# Patient Record
Sex: Female | Born: 1985 | Race: Asian | Hispanic: No | Marital: Married
Health system: Midwestern US, Academic
[De-identification: ages and names within clinical notes are randomized; demographics above are authoritative.]

## PROBLEM LIST (undated history)

## (undated) DIAGNOSIS — E063 Autoimmune thyroiditis: Secondary | ICD-10-CM

## (undated) DIAGNOSIS — J45909 Unspecified asthma, uncomplicated: Secondary | ICD-10-CM

## (undated) DIAGNOSIS — L309 Dermatitis, unspecified: Secondary | ICD-10-CM

## (undated) DIAGNOSIS — E079 Disorder of thyroid, unspecified: Secondary | ICD-10-CM

---

## 2016-07-16 ENCOUNTER — Encounter
Admit: 2016-07-16 | Discharge: 2016-07-16 | Payer: No Typology Code available for payment source | Primary: Student in an Organized Health Care Education/Training Program

## 2017-04-20 ENCOUNTER — Encounter
Admit: 2017-04-20 | Discharge: 2017-04-20 | Payer: No Typology Code available for payment source | Primary: Student in an Organized Health Care Education/Training Program

## 2017-04-21 MED ORDER — LEVOTHYROXINE 50 MCG PO TAB
50 ug | ORAL_TABLET | Freq: Every day | ORAL | 0 refills | 30.00000 days | Status: AC
Start: 2017-04-21 — End: 2017-04-25

## 2017-04-25 MED ORDER — LEVOTHYROXINE 50 MCG PO TAB
50 ug | ORAL_TABLET | Freq: Every day | ORAL | 0 refills | 30.00000 days | Status: AC
Start: 2017-04-25 — End: 2017-08-03

## 2017-05-06 ENCOUNTER — Encounter
Admit: 2017-05-06 | Discharge: 2017-05-06 | Payer: No Typology Code available for payment source | Primary: Student in an Organized Health Care Education/Training Program

## 2017-05-06 ENCOUNTER — Ambulatory Visit
Admit: 2017-05-06 | Discharge: 2017-05-06 | Payer: MEDICAID | Primary: Student in an Organized Health Care Education/Training Program

## 2017-05-06 DIAGNOSIS — Z0000 Encounter for general adult medical examination without abnormal findings: ICD-10-CM

## 2017-05-06 DIAGNOSIS — E063 Autoimmune thyroiditis: Principal | ICD-10-CM

## 2017-05-06 DIAGNOSIS — J45909 Unspecified asthma, uncomplicated: ICD-10-CM

## 2017-05-06 DIAGNOSIS — E079 Disorder of thyroid, unspecified: Principal | ICD-10-CM

## 2017-05-06 LAB — COMPREHENSIVE METABOLIC PANEL
Lab: 0.4 mg/dL — ABNORMAL HIGH (ref 0.3–1.2)
Lab: 0.6 mg/dL — ABNORMAL LOW (ref 0.4–1.00)
Lab: 10 mg/dL — ABNORMAL LOW (ref 7–25)
Lab: 104 MMOL/L (ref 98–110)
Lab: 13 U/L — ABNORMAL HIGH (ref 7–40)
Lab: 137 MMOL/L (ref 137–147)
Lab: 27 MMOL/L (ref 21–30)
Lab: 4.1 MMOL/L (ref 3.5–5.1)
Lab: 4.7 g/dL (ref 3.5–5.0)
Lab: 41 U/L (ref 25–110)
Lab: 5 U/L — ABNORMAL LOW (ref 7–56)
Lab: 6 g/dL (ref 3–12)
Lab: 7.7 g/dL — ABNORMAL LOW (ref 6.0–8.0)
Lab: 86 mg/dL — ABNORMAL HIGH (ref <3.0)
Lab: 9.4 mg/dL — ABNORMAL LOW (ref 8.5–10.6)
Lab: >60 mL/min — ABNORMAL HIGH (ref >60)
Lab: >60 mL/min — ABNORMAL HIGH (ref >60)

## 2017-05-06 LAB — TSH WITH FREE T4 REFLEX: Lab: 3.6 uU/mL (ref 0.35–5.00)

## 2017-08-03 ENCOUNTER — Encounter
Admit: 2017-08-03 | Discharge: 2017-08-03 | Payer: No Typology Code available for payment source | Primary: Student in an Organized Health Care Education/Training Program

## 2017-08-03 MED ORDER — LEVOTHYROXINE 50 MCG PO TAB
50 ug | ORAL_TABLET | Freq: Every day | ORAL | 1 refills | 30.00000 days | Status: AC
Start: 2017-08-03 — End: 2018-02-06

## 2017-11-21 ENCOUNTER — Encounter
Admit: 2017-11-21 | Discharge: 2017-11-21 | Payer: No Typology Code available for payment source | Primary: Student in an Organized Health Care Education/Training Program

## 2017-12-13 ENCOUNTER — Encounter
Admit: 2017-12-13 | Discharge: 2017-12-13 | Payer: No Typology Code available for payment source | Primary: Student in an Organized Health Care Education/Training Program

## 2017-12-13 DIAGNOSIS — J4531 Mild persistent asthma with (acute) exacerbation: Principal | ICD-10-CM

## 2017-12-14 MED ORDER — ALBUTEROL SULFATE 90 MCG/ACTUATION IN HFAA
2 | RESPIRATORY_TRACT | 3 refills | Status: AC | PRN
Start: 2017-12-14 — End: ?

## 2018-01-06 ENCOUNTER — Ambulatory Visit
Admit: 2018-01-06 | Discharge: 2018-01-07 | Payer: No Typology Code available for payment source | Primary: Student in an Organized Health Care Education/Training Program

## 2018-01-06 ENCOUNTER — Encounter
Admit: 2018-01-06 | Discharge: 2018-01-06 | Payer: No Typology Code available for payment source | Primary: Student in an Organized Health Care Education/Training Program

## 2018-01-06 DIAGNOSIS — E079 Disorder of thyroid, unspecified: Principal | ICD-10-CM

## 2018-01-06 DIAGNOSIS — J45909 Unspecified asthma, uncomplicated: ICD-10-CM

## 2018-01-06 MED ORDER — FLUTICASONE PROPION-SALMETEROL 250-50 MCG/DOSE IN DSDV
1 | Freq: Two times a day (BID) | RESPIRATORY_TRACT | 3 refills | Status: AC
Start: 2018-01-06 — End: ?

## 2018-01-07 DIAGNOSIS — J4540 Moderate persistent asthma, uncomplicated: Principal | ICD-10-CM

## 2018-02-04 ENCOUNTER — Encounter
Admit: 2018-02-04 | Discharge: 2018-02-04 | Payer: No Typology Code available for payment source | Primary: Student in an Organized Health Care Education/Training Program

## 2018-02-06 MED ORDER — LEVOTHYROXINE 50 MCG PO TAB
ORAL_TABLET | Freq: Every day | ORAL | 1 refills | 30.00000 days | Status: AC
Start: 2018-02-06 — End: 2018-08-08

## 2018-07-06 ENCOUNTER — Encounter: Admit: 2018-07-06 | Discharge: 2018-07-06 | Primary: Student in an Organized Health Care Education/Training Program

## 2018-08-01 ENCOUNTER — Encounter: Admit: 2018-08-01 | Discharge: 2018-08-01 | Primary: Student in an Organized Health Care Education/Training Program

## 2018-08-08 ENCOUNTER — Encounter: Admit: 2018-08-08 | Discharge: 2018-08-08 | Primary: Student in an Organized Health Care Education/Training Program

## 2018-08-08 DIAGNOSIS — E063 Autoimmune thyroiditis: Secondary | ICD-10-CM

## 2018-08-08 MED ORDER — LEVOTHYROXINE 50 MCG PO TAB
50 ug | ORAL_TABLET | Freq: Every day | ORAL | 0 refills | 30.00000 days | Status: DC
Start: 2018-08-08 — End: 2018-10-31

## 2018-08-08 NOTE — Telephone Encounter
Pt. returning call. LVM 1330

## 2018-08-08 NOTE — Telephone Encounter
Will refill once, patient will need TSH performed prior to further refills being ordered

## 2018-08-08 NOTE — Telephone Encounter
No current TSH routing to Dr. Lessie Dings for approval

## 2018-08-08 NOTE — Telephone Encounter
LVM for pt to call clinic to schedule appt 1st attempt.

## 2018-08-08 NOTE — Telephone Encounter
Routing to scheduling for an appointment

## 2018-08-09 NOTE — Telephone Encounter
Pt scheduled for med f/u 10/23/2018, she said she will come in for blood draw sometime around the end of July 2020. Closing encounter.

## 2018-10-22 ENCOUNTER — Encounter
Admit: 2018-10-22 | Discharge: 2018-10-22 | Payer: No Typology Code available for payment source | Primary: Student in an Organized Health Care Education/Training Program

## 2018-10-31 MED ORDER — LEVOTHYROXINE 50 MCG PO TAB
50 ug | ORAL_TABLET | Freq: Every day | ORAL | 0 refills | 30.00000 days | Status: DC
Start: 2018-10-31 — End: 2019-01-11

## 2018-11-01 ENCOUNTER — Encounter
Admit: 2018-11-01 | Discharge: 2018-11-01 | Payer: No Typology Code available for payment source | Primary: Student in an Organized Health Care Education/Training Program

## 2018-11-03 ENCOUNTER — Encounter
Admit: 2018-11-03 | Discharge: 2018-11-03 | Payer: No Typology Code available for payment source | Primary: Student in an Organized Health Care Education/Training Program

## 2019-01-11 ENCOUNTER — Encounter
Admit: 2019-01-11 | Discharge: 2019-01-11 | Payer: No Typology Code available for payment source | Primary: Student in an Organized Health Care Education/Training Program

## 2019-01-11 DIAGNOSIS — E063 Autoimmune thyroiditis: Secondary | ICD-10-CM

## 2019-01-11 MED ORDER — LEVOTHYROXINE 50 MCG PO TAB
50 ug | ORAL_TABLET | Freq: Every day | ORAL | 0 refills | 30.00000 days | Status: AC
Start: 2019-01-11 — End: ?

## 2019-01-11 NOTE — Telephone Encounter
Will refill at current dose at order TSH w/ reflex T4; patient will need appointment to ensure dose appropriate. Please message or call if there are questions!     Best,  Carola Frost, MD

## 2019-01-11 NOTE — Telephone Encounter
Routing for review/approval.

## 2019-01-12 ENCOUNTER — Encounter
Admit: 2019-01-12 | Discharge: 2019-01-12 | Payer: No Typology Code available for payment source | Primary: Student in an Organized Health Care Education/Training Program

## 2019-01-12 NOTE — Telephone Encounter
Routing for review.

## 2019-04-12 ENCOUNTER — Encounter
Admit: 2019-04-12 | Discharge: 2019-04-12 | Payer: No Typology Code available for payment source | Primary: Student in an Organized Health Care Education/Training Program

## 2019-06-19 DIAGNOSIS — E039 Hypothyroidism, unspecified: Secondary | ICD-10-CM | POA: Insufficient documentation

## 2019-09-09 ENCOUNTER — Emergency Department (INDEPENDENT_AMBULATORY_CARE_PROVIDER_SITE_OTHER)
Admission: RE | Admit: 2019-09-09 | Discharge: 2019-09-09 | Disposition: A | Discharge status: Home / Self Care | Payer: 59 | Source: Ambulatory Visit

## 2019-09-09 ENCOUNTER — Other Ambulatory Visit: Payer: Self-pay

## 2019-09-09 VITALS — BP 116/76 | HR 82 | Temp 98.6°F | Resp 17 | Ht 62.0 in | Wt 154.3 lb

## 2019-09-09 DIAGNOSIS — L301 Dyshidrosis [pompholyx]: Secondary | ICD-10-CM | POA: Diagnosis not present

## 2019-09-09 DIAGNOSIS — L03115 Cellulitis of right lower limb: Secondary | ICD-10-CM

## 2019-09-09 HISTORY — DX: Autoimmune thyroiditis: E06.3

## 2019-09-09 HISTORY — DX: Disorder of thyroid, unspecified: E07.9

## 2019-09-09 HISTORY — DX: Unspecified asthma, uncomplicated: J45.909

## 2019-09-09 HISTORY — DX: Dermatitis, unspecified: L30.9

## 2019-09-09 MED ORDER — AMOXICILLIN-POT CLAVULANATE 875-125 MG PO TABS: 1.0000 | ORAL_TABLET | Freq: Two times a day (BID) | ORAL | 0 refills | Status: DC

## 2019-09-09 MED ORDER — PREDNISONE 50 MG PO TABS
50.0000 mg | ORAL_TABLET | Freq: Every day | ORAL | 0 refills | Status: AC
Start: 2019-09-09 — End: 2019-09-14

## 2019-09-09 NOTE — ED Provider Notes (Signed)
[facility]    CSN: [id number] Arrival date & time: 09/09/19  1306      History   Chief Complaint Chief Complaint  Patient presents with  . Appointment  . Rash    HPI Michele Martinez is a 34 y.o. female.   HPI Michele Martinez is a 34 y.o. female presenting to UC with c/o 1 week of worsening itchy red blistering painful rash on hands and Right foot. Some areas have become cracked from dried skin and bleeding. Hx of severe eczema in the past. Symptoms flared up after cutting a tomato last week.  She used OTC creams with no relief. Denies fever, chills, n/v/d. She is relatively new to the area and does not have a dermatologist yet.    Past Medical History:  Diagnosis Date  . Asthma   . Eczema   . Hashimoto's disease   . Thyroid disease     Patient Active Problem List   Diagnosis Date Noted  . Acquired hypothyroidism 06/19/2019    History reviewed. No pertinent surgical history.  OB History   No obstetric history on file.      Home Medications    Prior to Admission medications   Medication Sig Start Date End Date Taking? Authorizing Provider  levothyroxine (SYNTHROID) 50 MCG tablet  07/12/19  Yes [provider]  albuterol (VENTOLIN HFA) 108 (90 Base) MCG/ACT inhaler Inhale into the lungs every 6 (six) hours as needed for wheezing or shortness of breath.    [provider]  amoxicillin-clavulanate (AUGMENTIN) 875-125 MG tablet Take 1 tablet by mouth 2 (two) times daily. One po bid x 7 days 09/09/19   Lurene Shadow, PA-C  predniSONE (DELTASONE) 50 MG tablet Take 1 tablet (50 mg total) by mouth daily with breakfast for 5 days. 09/09/19 09/14/19  Lurene Shadow, PA-C    Family History Family History  Problem Relation Age of Onset  . Healthy Mother   . Healthy Father     Social History Social History   Tobacco Use  . Smoking status: Never Smoker  . Smokeless tobacco: Never Used  Vaping Use  . Vaping Use: Never used  Substance Use Topics   . Alcohol use: Never  . Drug use: Never     Allergies   Patient has no known allergies.   Review of Systems Review of Systems  Constitutional: Negative for chills and fever.  Musculoskeletal: Positive for arthralgias and joint swelling.  Skin: Positive for color change, rash and wound.     Physical Exam Triage Vital Signs ED Triage Vitals  Enc Vitals Group     BP 09/09/19 1330 116/76     Pulse Rate 09/09/19 1330 82     Resp 09/09/19 1330 17     Temp 09/09/19 1330 98.6 F (37 C)     Temp Source 09/09/19 1330 Oral     SpO2 09/09/19 1330 97 %     Weight 09/09/19 1333 154 lb 5.2 oz (70 kg)     Height 09/09/19 1333 5\' 2"  (1.575 m)     Head Circumference --      Peak Flow --      Pain Score 09/09/19 1332 2     Pain Loc --      Pain Edu? --      Excl. in GC? --    No data found.  Updated Vital Signs BP 116/76 (BP Location: Right Arm)   Pulse 82   Temp 98.6 F (37 C) (  Oral)   Resp 17   Ht 5\' 2"  (1.575 m)   Wt 154 lb 5.2 oz (70 kg)   LMP 08/15/2019 (Approximate)   SpO2 97%   BMI 28.23 kg/m   Visual Acuity Right Eye Distance:   Left Eye Distance:   Bilateral Distance:    Right Eye Near:   Left Eye Near:    Bilateral Near:     Physical Exam Vitals and nursing note reviewed.  Constitutional:      Appearance: Normal appearance. She is well-developed.  HENT:     Head: Normocephalic and atraumatic.  Cardiovascular:     Rate and Rhythm: Normal rate.  Pulmonary:     Effort: Pulmonary effort is normal.  Musculoskeletal:        General: Normal range of motion.     Cervical back: Normal range of motion.  Skin:    General: Skin is warm and dry.     Findings: Erythema and rash present.       Neurological:     Mental Status: She is alert and oriented to person, place, and time.  Psychiatric:        Behavior: Behavior normal.      UC Treatments / Results  Labs (all labs ordered are listed, but only abnormal results are displayed) Labs Reviewed - No  data to display  EKG   Radiology No results found.  Procedures Procedures (including critical care time)  Medications Ordered in UC Medications - No data to display  Initial Impression / Assessment and Plan / UC Course  I have reviewed the triage vital signs and the nursing notes.  Pertinent labs & imaging results that were available during my care of the patient were reviewed by me and considered in my medical decision making (see chart for details).    Hx and exam c/w infected dyshidrotic eczema flare Mepilex applied to Right foot Encouraged f/u in 2 days for wound recheck AVS given   Final Clinical Impressions(s) / UC Diagnoses   Final diagnoses:  Cellulitis of right foot  Dyshidrotic eczema     Discharge Instructions      Keep bandage on foot in place until your return to urgent care in 2 days for a wound recheck and bandage change. Try to keep bandage clean and dry.   Take the antibiotic and prednisone as prescribed to help with the infection and your symptoms.  Use resource guide to schedule a follow up appointment with dermatologist.     ED Prescriptions    Medication Sig Dispense Auth. Provider   predniSONE (DELTASONE) 50 MG tablet Take 1 tablet (50 mg total) by mouth daily with breakfast for 5 days. 5 tablet 08/17/2019, PA-C   amoxicillin-clavulanate (AUGMENTIN) 875-125 MG tablet Take 1 tablet by mouth 2 (two) times daily. One po bid x 7 days 14 tablet Lurene Shadow, Lurene Shadow     PDMP not reviewed this encounter.   [place], Lurene Shadow 09/10/19 1143

## 2019-09-09 NOTE — ED Triage Notes (Signed)
Hx of eczema - no bad flare ups since she was a child - cutting a tomato last week and has flared since then  Rash to fingers and right foot Uses OTC cream - has not needed any prescription meds  COVID Pfizer vaccine

## 2019-09-09 NOTE — Discharge Instructions (Signed)
°  Keep bandage on foot in place until your return to urgent care in 2 days for a wound recheck and bandage change. Try to keep bandage clean and dry.   Take the antibiotic and prednisone as prescribed to help with the infection and your symptoms.  Use resource guide to schedule a follow up appointment with dermatologist.

## 2019-10-10 ENCOUNTER — Encounter
Admit: 2019-10-10 | Discharge: 2019-10-10 | Payer: No Typology Code available for payment source | Primary: Student in an Organized Health Care Education/Training Program

## 2019-11-02 ENCOUNTER — Encounter
Admit: 2019-11-02 | Discharge: 2019-11-02 | Payer: No Typology Code available for payment source | Primary: Student in an Organized Health Care Education/Training Program

## 2020-01-22 ENCOUNTER — Other Ambulatory Visit (HOSPITAL_COMMUNITY)
Admission: RE | Admit: 2020-01-22 | Discharge: 2020-01-22 | Disposition: A | Discharge status: Home / Self Care | Payer: 59 | Source: Ambulatory Visit | Attending: Advanced Practice Midwife | Admitting: Advanced Practice Midwife

## 2020-01-22 ENCOUNTER — Ambulatory Visit (INDEPENDENT_AMBULATORY_CARE_PROVIDER_SITE_OTHER): Payer: 59 | Admitting: Advanced Practice Midwife

## 2020-01-22 ENCOUNTER — Encounter: Payer: Self-pay | Admitting: Advanced Practice Midwife

## 2020-01-22 ENCOUNTER — Other Ambulatory Visit: Payer: Self-pay

## 2020-01-22 VITALS — BP 101/60 | HR 77 | Wt 161.0 lb

## 2020-01-22 DIAGNOSIS — Z3480 Encounter for supervision of other normal pregnancy, unspecified trimester: Secondary | ICD-10-CM | POA: Insufficient documentation

## 2020-01-22 DIAGNOSIS — E039 Hypothyroidism, unspecified: Secondary | ICD-10-CM | POA: Diagnosis present

## 2020-01-22 DIAGNOSIS — J4520 Mild intermittent asthma, uncomplicated: Secondary | ICD-10-CM | POA: Diagnosis present

## 2020-01-22 DIAGNOSIS — I10 Essential (primary) hypertension: Secondary | ICD-10-CM | POA: Insufficient documentation

## 2020-01-22 MED ORDER — POLYETHYLENE GLYCOL 3350 17 G PO PACK: 17.0000 g | PACK | Freq: Every day | ORAL | 0 refills | Status: DC

## 2020-01-22 NOTE — Progress Notes (Signed)
DATING AND VIABILITY SONOGRAM   Michele Martinez is a 34 y.o. year old G2P1001 with LMP Patient's last menstrual period was 11/17/2019. which would correlate to  [redacted]w[redacted]d weeks gestation.  She has regular menstrual cycles.   She is here today for a confirmatory initial sonogram.    GESTATION: SINGLETON     FETAL ACTIVITY:          Heart rate       184 bpm          The fetus is active.     GESTATIONAL AGE AND  BIOMETRICS:  Gestational criteria: Estimated Date of Delivery: 08/23/20 by LMP now at [redacted]w[redacted]d  Previous Scans:0      CROWN RUMP LENGTH           2.98 cm         9-6 weeks                                                                               AVERAGE EGA(BY THIS SCAN): 9-6 weeks  WORKING EDD( LMP ):  08/23/2020     TECHNICIAN COMMENTS:  Patient informed that the ultrasound is considered a limited obstetric ultrasound and is not intended to be a complete ultrasound exam. Patient also informed that the ultrasound is not being completed with the intent of assessing for fetal or placental anomalies or any pelvic abnormalities. Explained that the purpose of today's ultrasound is to assess for fetal heart rate. Patient acknowledges the purpose of the exam and the limitations of the study.   Armandina Stammer 01/22/2020 10:39 AM

## 2020-01-22 NOTE — Progress Notes (Signed)
INITIAL OBSTETRICAL VISIT Patient name: Michele Martinez MRN [id number]  Date of birth: 1985-02-25 Chief Complaint:   Initial Prenatal Visit  History of Present Illness:   Michele Martinez is a 34 y.o. G56P1001 [place] female at 64w3dby LMP c/w u/s at 9+ weeks with an Estimated Date of Delivery: 08/23/20 being seen today for her initial obstetrical visit.   Her obstetrical history is significant for nothing.   Today she reports constipation.  No flowsheet data found.  Patient's last menstrual period was 11/17/2019. Last pap years ago. Results were: normal Review of Systems:   Pertinent items are noted in HPI Denies cramping/contractions, leakage of fluid, vaginal bleeding, abnormal vaginal discharge w/ itching/odor/irritation, headaches, visual changes, shortness of breath, chest pain, abdominal pain, severe nausea/vomiting, or problems with urination or bowel movements unless otherwise stated above.  Pertinent History Reviewed:  Reviewed past medical,surgical, social, obstetrical and family history.  Reviewed problem list, medications and allergies. OB History  Gravida Para Term Preterm AB Living  _0 SAB IAB Ectopic Multiple Live Births          1    # Outcome Date GA Lbr Len/2nd Weight Sex Delivery Anes PTL Lv  2 Current           1 Term 2014 [id number] lb 9.8 oz (3 kg) F Vag-Spont EPI N LIV   Physical Assessment:   Vitals:   01/22/20 0955  BP: 101/60  Pulse: 77  Weight: 161 lb (73 kg)  Body mass index is 29.45 kg/m.       Physical Examination:  General appearance - well appearing, and in no distress  Mental status - alert, oriented to person, place, and time  Psych:  She has a normal mood and affect  Skin - warm and dry, normal color, no suspicious lesions noted  Chest - effort normal, all lung fields clear to auscultation bilaterally  Heart - normal rate and regular rhythm  Abdomen - soft, nontender  Extremities:  No swelling or varicosities noted  Pelvic - VULVA:  normal appearing vulva with no masses, tenderness or lesions  VAGINA: normal appearing vagina with normal color and discharge, no lesions  CERVIX: normal appearing cervix without discharge or lesions, no CMT  Thin prep pap is done  HR HPV cotesting  Chaperone: JeKathrene AluN      Assessment & Plan:  1) High-Risk Pregnancy G2P1001 at 9w57w3dth an Estimated Date of Delivery: 08/23/20   2) Initial OB visit  3) Hypothyroidism  4)  There is a diagnosis of Hypertension on problem list but patient states has never had this. There is no documentation of evidence for this.  Will send baseline labs but doubt she has it.  5)  Constipation, Rx miralax  Meds:  Meds ordered this encounter  Medications  . polyethylene glycol (MIRALAX) 17 g packet    Sig: Take 17 g by mouth daily.    Dispense:  14 each    Refill:  0    Order Specific Question:   Supervising Provider    Answer:   CONSTANT, PEGGY [[id number]]    Initial labs obtained Continue prenatal vitamins Reviewed n/v relief measures and warning s/s to report Reviewed recommended weight gain based on pre-gravid BMI Encouraged well-balanced diet Genetic & carrier screening discussed: undecided about Panorama, undecided about Panorama Ultrasound discussed; fetal survey: requested CCNC completed> form faxed if has or is planning to apply for medicaid The nature of  [facility] with multiple MDs and other Advanced Practice Providers was explained to patient; also emphasized that fellows, residents, and students are part of our team.   Indications for ASA therapy (per uptodate) One of the following: H/O preeclampsia, especially early onset/adverse outcome No Multifetal gestation No CHTN No  Documented but no evidence of basis for it T1DM or T2DM No Chronic kidney disease No Autoimmune disease (antiphospholipid syndrome, systemic lupus erythematosus) No  OR Two or more of the following: Nulliparity No Obesity  (BMI>30 kg/m2) No Family h/o preeclampsia in mother or sister No Age ?35 years No Sociodemographic characteristics (African American race, low socioeconomic level) No Personal risk factors (eg, previous pregnancy w/ LBW or SGA, previous adverse pregnancy outcome [eg, stillbirth], interval >10 years between pregnancies) No  Indications for early A1C (per uptodate) BMI >=25 (>=23 in Asian women) AND one of the following GDM in a previous pregnancy No Previous A1C?5.7, impaired glucose tolerance, or impaired fasting glucose on previous testing No First-degree relative with diabetes No High-risk race/ethnicity (eg, African American, Latino, Native American, Asian American, Pacific Islander) Yes History of cardiovascular disease No HTN or on therapy for hypertension No HDL cholesterol level <35 mg/dL (0.90 mmol/L) and/or a triglyceride level >250 mg/dL (2.82 mmol/L) No PCOS No Physical inactivity No Other clinical condition associated with insulin resistance (eg, severe obesity, acanthosis nigricans) No Previous birth of an infant weighing ?4000 g No Previous stillbirth of unknown cause No >= 40yo No  Follow-up: Return in about 4 weeks (around 02/19/2020) for [employer].   Orders Placed This Encounter  Procedures  . Culture, OB Urine  . [place] MFM OB COMP + 14 WK  . CBC/D/Plt+RPR+Rh+ABO+Rub Ab...  . Hemoglobpathy+Fer w/A Thal Rfx  . Comp Met (CMET)  . Protein / creatinine ratio, urine  . TSH  . T3  . T4, free    Hansel Feinstein CNM, [facility] 01/22/2020 12:25 PM

## 2020-01-22 NOTE — Patient Instructions (Signed)
Constipation, Adult Constipation is when a person:  Poops (has a bowel movement) fewer times in a week than normal.  Has a hard time pooping.  Has poop that is dry, hard, or bigger than normal. Follow these instructions at home: Eating and drinking   Eat foods that have a lot of fiber, such as: ? Fresh fruits and vegetables. ? Whole grains. ? Beans.  Eat less of foods that are high in fat, low in fiber, or overly processed, such as: ? [place] fries. ? Hamburgers. ? Cookies. ? Candy. ? Soda.  Drink enough fluid to keep your pee (urine) clear or pale yellow. General instructions  Exercise regularly or as told by your doctor.  Go to the restroom when you feel like you need to poop. Do not hold it in.  Take over-the-counter and prescription medicines only as told by your doctor. These include any fiber supplements.  Do pelvic floor retraining exercises, such as: ? Doing deep breathing while relaxing your lower belly (abdomen). ? Relaxing your pelvic floor while pooping.  Watch your condition for any changes.  Keep all follow-up visits as told by your doctor. This is important. Contact a doctor if:  You have pain that gets worse.  You have a fever.  You have not pooped for 4 days.  You throw up (vomit).  You are not hungry.  You lose weight.  You are bleeding from the anus.  You have thin, pencil-like poop (stool). Get help right away if:  You have a fever, and your symptoms suddenly get worse.  You leak poop or have blood in your poop.  Your belly feels hard or bigger than normal (is bloated).  You have very bad belly pain.  You feel dizzy or you faint. This information is not intended to replace advice given to you by your health care provider. Make sure you discuss any questions you have with your health care provider. Document Revised: 12/31/2016 Document Reviewed: 07/09/2015 Elsevier Patient Education  2020 [employer]. First Trimester of  Pregnancy  The first trimester of pregnancy is from week 1 until the end of week 13 (months 1 through 3). During this time, your baby will begin to develop inside you. At 6-8 weeks, the eyes and face are formed, and the heartbeat can be seen on ultrasound. At the end of 12 weeks, all the baby's organs are formed. Prenatal care is all the medical care you receive before the birth of your baby. Make sure you get good prenatal care and follow all of your doctor's instructions. Follow these instructions at home: Medicines  Take over-the-counter and prescription medicines only as told by your doctor. Some medicines are safe and some medicines are not safe during pregnancy.  Take a prenatal vitamin that contains at least 600 micrograms (mcg) of folic acid.  If you have trouble pooping (constipation), take medicine that will make your stool soft (stool softener) if your doctor approves. Eating and drinking   Eat regular, healthy meals.  Your doctor will tell you the amount of weight gain that is right for you.  Avoid raw meat and uncooked cheese.  If you feel sick to your stomach (nauseous) or throw up (vomit): ? Eat 4 or 5 small meals a day instead of 3 large meals. ? Try eating a few soda crackers. ? Drink liquids between meals instead of during meals.  To prevent constipation: ? Eat foods that are high in fiber, like fresh fruits and vegetables, whole grains, and  beans. ? Drink enough fluids to keep your pee (urine) clear or pale yellow. Activity  Exercise only as told by your doctor. Stop exercising if you have cramps or pain in your lower belly (abdomen) or low back.  Do not exercise if it is too hot, too humid, or if you are in a place of great height (high altitude).  Try to avoid standing for long periods of time. Move your legs often if you must stand in one place for a long time.  Avoid heavy lifting.  Wear low-heeled shoes. Sit and stand up straight.  You can have sex  unless your doctor tells you not to. Relieving pain and discomfort  Wear a good support bra if your breasts are sore.  Take warm water baths (sitz baths) to soothe pain or discomfort caused by hemorrhoids. Use hemorrhoid cream if your doctor says it is okay.  Rest with your legs raised if you have leg cramps or low back pain.  If you have puffy, bulging veins (varicose veins) in your legs: ? Wear support hose or compression stockings as told by your doctor. ? Raise (elevate) your feet for 15 minutes, 3-4 times a day. ? Limit salt in your food. Prenatal care  Schedule your prenatal visits by the twelfth week of pregnancy.  Write down your questions. Take them to your prenatal visits.  Keep all your prenatal visits as told by your doctor. This is important. Safety  Wear your seat belt at all times when driving.  Make a list of emergency phone numbers. The list should include numbers for family, friends, the hospital, and police and fire departments. General instructions  Ask your doctor for a referral to a local prenatal class. Begin classes no later than at the start of month 6 of your pregnancy.  Ask for help if you need counseling or if you need help with nutrition. Your doctor can give you advice or tell you where to go for help.  Do not use hot tubs, steam rooms, or saunas.  Do not douche or use tampons or scented sanitary pads.  Do not cross your legs for long periods of time.  Avoid all herbs and alcohol. Avoid drugs that are not approved by your doctor.  Do not use any tobacco products, including cigarettes, chewing tobacco, and electronic cigarettes. If you need help quitting, ask your doctor. You may get counseling or other support to help you quit.  Avoid cat litter boxes and soil used by cats. These carry germs that can cause birth defects in the baby and can cause a loss of your baby (miscarriage) or stillbirth.  Visit your dentist. At home, brush your teeth with  a soft toothbrush. Be gentle when you floss. Contact a doctor if:  You are dizzy.  You have mild cramps or pressure in your lower belly.  You have a nagging pain in your belly area.  You continue to feel sick to your stomach, you throw up, or you have watery poop (diarrhea).  You have a bad smelling fluid coming from your vagina.  You have pain when you pee (urinate).  You have increased puffiness (swelling) in your face, hands, legs, or ankles. Get help right away if:  You have a fever.  You are leaking fluid from your vagina.  You have spotting or bleeding from your vagina.  You have very bad belly cramping or pain.  You gain or lose weight rapidly.  You throw up blood. It may look  like coffee grounds.  You are around people who have [place] measles, fifth disease, or chickenpox.  You have a very bad headache.  You have shortness of breath.  You have any kind of trauma, such as from a fall or a car accident. Summary  The first trimester of pregnancy is from week 1 until the end of week 13 (months 1 through 3).  To take care of yourself and your unborn baby, you will need to eat healthy meals, take medicines only if your doctor tells you to do so, and do activities that are safe for you and your baby.  Keep all follow-up visits as told by your doctor. This is important as your doctor will have to ensure that your baby is healthy and growing well. This information is not intended to replace advice given to you by your health care provider. Make sure you discuss any questions you have with your health care provider. Document Revised: 05/11/2018 Document Reviewed: 01/27/2016 Elsevier Patient Education  2020 [employer].

## 2020-01-23 LAB — PROTEIN / CREATININE RATIO, URINE
Creatinine, Urine: 19.7 mg/dL
Protein, Ur: <4 mg/dL

## 2020-01-24 LAB — CBC/D/PLT+RPR+RH+ABO+RUB AB...
Antibody Screen: NEGATIVE
Basophils Absolute: 0 10*3/uL (ref 0.0–0.2)
Basos: 0 %
EOS (ABSOLUTE): 0.2 10*3/uL (ref 0.0–0.4)
Eos: 3 %
HCV Ab: <0.1 s/co ratio (ref 0.0–0.9)
HIV Screen 4th Generation wRfx: NONREACTIVE
Hematocrit: 37.1 % (ref 34.0–46.6)
Hemoglobin: 12.5 g/dL (ref 11.1–15.9)
Hepatitis B Surface Ag: NEGATIVE
Immature Grans (Abs): 0 10*3/uL (ref 0.0–0.1)
Immature Granulocytes: 0 %
Lymphocytes Absolute: 1.4 10*3/uL (ref 0.7–3.1)
Lymphs: 17 %
MCH: 29.5 pg (ref 26.6–33.0)
MCHC: 33.7 g/dL (ref 31.5–35.7)
MCV: 88 fL (ref 79–97)
Monocytes Absolute: 0.5 10*3/uL (ref 0.1–0.9)
Monocytes: 7 %
Neutrophils Absolute: 5.9 10*3/uL (ref 1.4–7.0)
Neutrophils: 73 %
Platelets: 280 10*3/uL (ref 150–450)
RBC: 4.24 x10E6/uL (ref 3.77–5.28)
RDW: 13 % (ref 11.7–15.4)
RPR Ser Ql: NONREACTIVE
Rh Factor: POSITIVE
Rubella Antibodies, IGG: 2 index (ref >0.99)
WBC: 8 10*3/uL (ref 3.4–10.8)

## 2020-01-24 LAB — CYTOLOGY - PAP
Chlamydia: NEGATIVE
Comment: NEGATIVE
Comment: NEGATIVE
Comment: NORMAL
Diagnosis: NEGATIVE
High risk HPV: NEGATIVE
Neisseria Gonorrhea: NEGATIVE

## 2020-01-24 LAB — COMPREHENSIVE METABOLIC PANEL
ALT: 11 IU/L (ref 0–32)
AST: 17 IU/L (ref 0–40)
Albumin/Globulin Ratio: 1.6 (ref 1.2–2.2)
Albumin: 4.5 g/dL (ref 3.8–4.8)
Alkaline Phosphatase: 43 IU/L — ABNORMAL LOW (ref 44–121)
BUN/Creatinine Ratio: 17 (ref 9–23)
BUN: 8 mg/dL (ref 6–20)
Bilirubin Total: 0.2 mg/dL (ref 0.0–1.2)
CO2: 18 mmol/L — ABNORMAL LOW (ref 20–29)
Calcium: 9.7 mg/dL (ref 8.7–10.2)
Chloride: 100 mmol/L (ref 96–106)
Creatinine, Ser: 0.48 mg/dL — ABNORMAL LOW (ref 0.57–1.00)
GFR calc Af Amer: 150 mL/min/{1.73_m2} (ref >59)
GFR calc non Af Amer: 130 mL/min/{1.73_m2} (ref >59)
Globulin, Total: 2.9 g/dL (ref 1.5–4.5)
Glucose: 74 mg/dL (ref 65–99)
Potassium: 4.2 mmol/L (ref 3.5–5.2)
Sodium: 135 mmol/L (ref 134–144)
Total Protein: 7.4 g/dL (ref 6.0–8.5)

## 2020-01-24 LAB — CULTURE, OB URINE

## 2020-01-24 LAB — HEMOGLOBPATHY+FER W/A THAL RFX
Ferritin: 119 ng/mL (ref 15–150)
Hgb A2: 2.9 % (ref 1.8–3.2)
Hgb A: 97.1 % (ref 96.4–98.8)
Hgb F: 0 % (ref 0.0–2.0)
Hgb S: 0 %

## 2020-01-24 LAB — HCV INTERPRETATION

## 2020-01-24 LAB — URINE CULTURE, OB REFLEX: Organism ID, Bacteria: NO GROWTH

## 2020-01-24 LAB — TSH: TSH: 6.35 u[IU]/mL — ABNORMAL HIGH (ref 0.450–4.500)

## 2020-01-31 MED ORDER — LEVOTHYROXINE SODIUM 125 MCG PO TABS
125.0000 ug | ORAL_TABLET | Freq: Every day | ORAL | 2 refills | Status: DC
Start: 2020-01-31 — End: 2020-05-06

## 2020-02-02 NOTE — L&D Delivery Note (Signed)
Delivery Note At 1014 a viable female infant was delivered via SVD in the water, presentation: OA. APGAR: 8, 10; weight 7'1.   Placenta status: spontaneously delivered intact with gentle cord traction. Fundus firm with massage and Pitocin.   Anesthesia: local Lacerations: 2nd degree and bilateral labials; left labial repaired, right labial hemostatic Suture used for repair: 2-0, 3-0 Vicryl rapide Est. Blood Loss (mL): 73 Placenta to LD Complications tight nuchal, unable to somersault through, reduced after delivery Cord ph n/a   Mom to postpartum. Baby to Couplet care / Skin to Skin.    Donette Larry, CNM 08/20/2020 11:37 AM

## 2020-02-04 ENCOUNTER — Telehealth: Payer: Self-pay

## 2020-02-04 MED ORDER — TERCONAZOLE 0.4 % VA CREA: 1.0000 | TOPICAL_CREAM | Freq: Every day | VAGINAL | 0 refills | Status: AC

## 2020-02-04 NOTE — Telephone Encounter (Signed)
Patient called stating she has a yeast infection. Patient has been using MOnistat over the counter but just on her vulva. Patient made aware that she can use the applicator while pregnant (she is 11.2 wks). Advise to only place applicator about half way inside her vagina. Patient offered teraconazole for 3 days per protocol. Patient accepts and sent to her pharmacy. Armandina Stammer RN

## 2020-02-19 ENCOUNTER — Other Ambulatory Visit: Payer: Self-pay

## 2020-02-19 ENCOUNTER — Encounter: Payer: Self-pay | Admitting: Advanced Practice Midwife

## 2020-02-19 ENCOUNTER — Ambulatory Visit (INDEPENDENT_AMBULATORY_CARE_PROVIDER_SITE_OTHER): Payer: 59 | Admitting: Advanced Practice Midwife

## 2020-02-19 VITALS — BP 104/63 | HR 81 | Wt 159.0 lb

## 2020-02-19 DIAGNOSIS — Z3A13 13 weeks gestation of pregnancy: Secondary | ICD-10-CM

## 2020-02-19 DIAGNOSIS — J4520 Mild intermittent asthma, uncomplicated: Secondary | ICD-10-CM

## 2020-02-19 DIAGNOSIS — E039 Hypothyroidism, unspecified: Secondary | ICD-10-CM

## 2020-02-19 DIAGNOSIS — Z3480 Encounter for supervision of other normal pregnancy, unspecified trimester: Secondary | ICD-10-CM

## 2020-02-19 NOTE — Progress Notes (Signed)
° °  PRENATAL VISIT NOTE  Subjective:  Michele Martinez is a 35 y.o. G2P1001 at [redacted]w[redacted]d being seen today for ongoing prenatal care.  She is currently monitored for the following issues for this low-risk pregnancy and has Hypothyroidism; Supervision of other normal pregnancy, antepartum; Benign essential hypertension; and Mild intermittent asthma on their problem list.  Patient reports nausea is better, some fatigue..  Contractions: Not present.  .  Movement: Absent. Denies leaking of fluid.   The following portions of the patient's history were reviewed and updated as appropriate: allergies, current medications, past family history, past medical history, past social history, past surgical history and problem list.   Objective:   Vitals:   02/19/20 1006  BP: 104/63  Pulse: 81  Weight: 159 lb (72.1 kg)    Fetal Status: Fetal Heart Rate (bpm): 160   Movement: Absent     General:  Alert, oriented and cooperative. Patient is in no acute distress.  Skin: Skin is warm and dry. No rash noted.   Cardiovascular: Normal heart rate noted  Respiratory: Normal respiratory effort, no problems with respiration noted  Abdomen: Soft, gravid, appropriate for gestational age.  Pain/Pressure: Present     Pelvic: Cervical exam deferred        Extremities: Normal range of motion.  Edema: None  Mental Status: Normal mood and affect. Normal behavior. Normal judgment and thought content.   Assessment and Plan:  Pregnancy: G2P1001 at [redacted]w[redacted]d 1. Supervision of other normal pregnancy, antepartum      Reviewed lab results are normal except TSH  2. Hypothyroidism, unspecified type      TSH elevated in December   Synthroid increased from 75 >      Will recheck TSH today - TSH  3. Mild intermittent asthma, unspecified whether complicated     No complaints  4. [redacted] weeks gestation of pregnancy      [place] for anatomy in March  Preterm labor symptoms and general obstetric precautions including but not limited to  vaginal bleeding, contractions, leaking of fluid and fetal movement were reviewed in detail with the patient. Please refer to After Visit Summary for other counseling recommendations.   Return in about 4 weeks (around 03/18/2020) for TELEHEALTH VISIT.  Future Appointments  Date Time Provider Department Center  03/18/2020 11:15 AM Aviva Signs, CNM [facility] None  04/01/2020 12:45 PM [facility] US4 [facility]    Wynelle Bourgeois, CNM

## 2020-02-19 NOTE — Patient Instructions (Signed)

## 2020-02-20 LAB — TSH: TSH: 0.64 u[IU]/mL (ref 0.450–4.500)

## 2020-03-04 ENCOUNTER — Ambulatory Visit: Payer: 59 | Attending: Internal Medicine

## 2020-03-04 ENCOUNTER — Other Ambulatory Visit (HOSPITAL_BASED_OUTPATIENT_CLINIC_OR_DEPARTMENT_OTHER): Payer: Self-pay | Admitting: Internal Medicine

## 2020-03-04 DIAGNOSIS — Z23 Encounter for immunization: Secondary | ICD-10-CM

## 2020-03-04 MED FILL — PFIZER-BIONTECH COVID-19 VA: 30 | 21 days supply | Qty: 0 | Fill #0

## 2020-03-04 NOTE — Progress Notes (Signed)
   Covid-19 Vaccination Clinic  Name:  Kyesha Balla    MRN: [id number] DOB: 1985-12-15  03/04/2020  Ms. Coppernoll was observed post Covid-19 immunization for 15 minutes without incident. She was provided with Vaccine Information Sheet and instruction to access the V-Safe system. Vaccinated by [employer].  Ms. Cryder was instructed to call 911 with any severe reactions post vaccine: Marland Kitchen Difficulty breathing  . Swelling of face and throat  . A fast heartbeat  . A bad rash all over body  . Dizziness and weakness   Immunizations Administered    Name Date Dose VIS Date Route   PFIZER Comrnaty(Gray TOP) Covid-19 Vaccine 03/04/2020 10:44 AM 0.3 mL 01/10/2020 Intramuscular   Manufacturer: [employer], [employer]   Lot: [id number]   NDC: [phone]

## 2020-03-18 ENCOUNTER — Telehealth (INDEPENDENT_AMBULATORY_CARE_PROVIDER_SITE_OTHER): Payer: 59 | Admitting: Advanced Practice Midwife

## 2020-03-18 DIAGNOSIS — Z3A17 17 weeks gestation of pregnancy: Secondary | ICD-10-CM

## 2020-03-18 DIAGNOSIS — Z3482 Encounter for supervision of other normal pregnancy, second trimester: Secondary | ICD-10-CM

## 2020-03-18 DIAGNOSIS — Z3480 Encounter for supervision of other normal pregnancy, unspecified trimester: Secondary | ICD-10-CM

## 2020-03-18 NOTE — Progress Notes (Signed)
   OBSTETRICS PRENATAL VIRTUAL VISIT ENCOUNTER NOTE  Provider [facility]: [facility]   I connected with Michele Martinez on 03/18/20 at 11:15 AM EST by MyChart Video Encounter at home and verified that I am speaking with the correct person using two identifiers.   I discussed the limitations, risks, security and privacy concerns of performing an evaluation and management service virtually and the availability of in person appointments. I also discussed with the patient that there may be a patient responsible charge related to this service. The patient expressed understanding and agreed to proceed. Subjective:  Michele Martinez is a 35 y.o. G2P1001 at [redacted]w[redacted]d being seen today for ongoing prenatal care.  She is currently monitored for the following issues for this low-risk pregnancy and has Hypothyroidism; Supervision of other normal pregnancy, antepartum; Benign essential hypertension; and Mild intermittent asthma on their problem list.  Patient reports no complaints.  Contractions: Not present. Vag. Bleeding: None.   . Denies any leaking of fluid.   The following portions of the patient's history were reviewed and updated as appropriate: allergies, current medications, past family history, past medical history, past social history, past surgical history and problem list.   Objective:  There were no vitals filed for this visit.  Fetal Status:           General:  Alert, oriented and cooperative. Patient is in no acute distress.  Respiratory: Normal respiratory effort, no problems with respiration noted  Mental Status: Normal mood and affect. Normal behavior. Normal judgment and thought content.  Rest of physical exam deferred due to type of encounter  Imaging: No results found.  Assessment and Plan:  Pregnancy: G2P1001 at [redacted]w[redacted]d 1. [redacted] weeks gestation of pregnancy     Considering waterbirth     Discussed starting with taking class.  2. Supervision of other normal  pregnancy, antepartum      Anatomy US scheduled  Preterm labor symptoms and general obstetric precautions including but not limited to vaginal bleeding, contractions, leaking of fluid and fetal movement were reviewed in detail with the patient. I discussed the assessment and treatment plan with the patient. The patient was provided an opportunity to ask questions and all were answered. The patient agreed with the plan and demonstrated an understanding of the instructions. The patient was advised to call back or seek an in-person office evaluation/go to [facility] at [facility] for any urgent or concerning symptoms. Please refer to After Visit Summary for other counseling recommendations.   I provided 10 minutes of face-to-face time during this encounter.  RTO 4 weeks, recheck TSH next visit  Future Appointments  Date Time Provider Department Center  04/01/2020 12:45 PM [facility] US4 [facility]    Wynelle Bourgeois, CNM [facility] [employer]

## 2020-03-24 ENCOUNTER — Telehealth: Payer: Self-pay

## 2020-03-24 NOTE — Telephone Encounter (Signed)
Pt called wanting to know if it is normal to have some spotting after intercourse. Pt made aware that it is normal to have some spotting after intercourse because the cervix is friable during pregnancy. Advised pt to go to [facility] if she starts to bleed heavy like a period. Understanding was voiced. Michele Martinez, CMA

## 2020-04-01 ENCOUNTER — Other Ambulatory Visit: Payer: Self-pay | Admitting: *Deleted

## 2020-04-01 ENCOUNTER — Other Ambulatory Visit: Payer: Self-pay

## 2020-04-01 ENCOUNTER — Ambulatory Visit: Payer: 59 | Admitting: *Deleted

## 2020-04-01 ENCOUNTER — Ambulatory Visit: Payer: 59 | Attending: Advanced Practice Midwife

## 2020-04-01 ENCOUNTER — Other Ambulatory Visit: Payer: Self-pay | Admitting: Advanced Practice Midwife

## 2020-04-01 DIAGNOSIS — J4520 Mild intermittent asthma, uncomplicated: Secondary | ICD-10-CM

## 2020-04-01 DIAGNOSIS — E039 Hypothyroidism, unspecified: Secondary | ICD-10-CM

## 2020-04-01 DIAGNOSIS — Z3480 Encounter for supervision of other normal pregnancy, unspecified trimester: Secondary | ICD-10-CM | POA: Diagnosis not present

## 2020-04-01 DIAGNOSIS — I10 Essential (primary) hypertension: Secondary | ICD-10-CM | POA: Diagnosis present

## 2020-04-01 DIAGNOSIS — O99280 Endocrine, nutritional and metabolic diseases complicating pregnancy, unspecified trimester: Secondary | ICD-10-CM

## 2020-04-15 ENCOUNTER — Encounter: Payer: 59 | Admitting: Advanced Practice Midwife

## 2020-04-16 ENCOUNTER — Ambulatory Visit (INDEPENDENT_AMBULATORY_CARE_PROVIDER_SITE_OTHER): Payer: 59 | Admitting: Family Medicine

## 2020-04-16 ENCOUNTER — Other Ambulatory Visit: Payer: Self-pay

## 2020-04-16 VITALS — BP 105/53 | HR 71 | Wt 173.0 lb

## 2020-04-16 DIAGNOSIS — Z3480 Encounter for supervision of other normal pregnancy, unspecified trimester: Secondary | ICD-10-CM

## 2020-04-16 DIAGNOSIS — E039 Hypothyroidism, unspecified: Secondary | ICD-10-CM

## 2020-04-16 DIAGNOSIS — Z3A21 21 weeks gestation of pregnancy: Secondary | ICD-10-CM

## 2020-04-16 NOTE — Progress Notes (Signed)
   PRENATAL VISIT NOTE  Subjective:  Michele Martinez is a 35 y.o. G2P1001 at [redacted]w[redacted]d being seen today for ongoing prenatal care.  She is currently monitored for the following issues for this low-risk pregnancy and has Hypothyroidism; Supervision of other normal pregnancy, antepartum; and Mild intermittent asthma on their problem list.  Patient reports no complaints.  Contractions: Not present. Vag. Bleeding: None.  Movement: Present. Denies leaking of fluid.   The following portions of the patient's history were reviewed and updated as appropriate: allergies, current medications, past family history, past medical history, past social history, past surgical history and problem list.   Objective:   Vitals:   04/16/20 1333  BP: (!) 105/53  Pulse: 71  Weight: 173 lb (78.5 kg)    Fetal Status: Fetal Heart Rate (bpm): 152   Movement: Present     General:  Alert, oriented and cooperative. Patient is in no acute distress.  Skin: Skin is warm and dry. No rash noted.   Cardiovascular: Normal heart rate noted  Respiratory: Normal respiratory effort, no problems with respiration noted  Abdomen: Soft, gravid, appropriate for gestational age.  Pain/Pressure: Present     Pelvic: Cervical exam deferred        Extremities: Normal range of motion.  Edema: Trace  Mental Status: Normal mood and affect. Normal behavior. Normal judgment and thought content.   Assessment and Plan:  Pregnancy: G2P1001 at [redacted]w[redacted]d 1. [redacted] weeks gestation of pregnancy   2. Hypothyroidism, unspecified type Repeat TSH 2nd trimester - TSH  3. Supervision of other normal pregnancy, antepartum Normal anatomy Desires waterbirth  General obstetric precautions including but not limited to vaginal bleeding, contractions, leaking of fluid and fetal movement were reviewed in detail with the patient. Please refer to After Visit Summary for other counseling recommendations.   Return in 4 weeks (on 05/14/2020).  Future Appointments   Date Time Provider Department Center  05/01/2020 11:15 AM [facility] NURSE [facility]  05/01/2020 11:30 AM [facility] US3 [facility]  05/14/2020 11:00 AM Levie Heritage, DO [facility] None  06/03/2020  8:30 AM Aviva Signs, CNM [facility] None  06/17/2020 11:15 AM Rasch, Harolyn Rutherford, NP [facility] None  07/01/2020 11:15 AM Aviva Signs, CNM [facility] None    Reva Bores, MD

## 2020-04-16 NOTE — Patient Instructions (Signed)

## 2020-04-17 LAB — TSH: TSH: 0.225 u[IU]/mL — ABNORMAL LOW (ref 0.450–4.500)

## 2020-05-01 ENCOUNTER — Ambulatory Visit: Payer: 59 | Attending: Obstetrics and Gynecology

## 2020-05-01 ENCOUNTER — Other Ambulatory Visit: Payer: Self-pay

## 2020-05-01 ENCOUNTER — Encounter: Payer: Self-pay | Admitting: *Deleted

## 2020-05-01 ENCOUNTER — Ambulatory Visit: Payer: 59 | Admitting: *Deleted

## 2020-05-01 ENCOUNTER — Other Ambulatory Visit: Payer: Self-pay | Admitting: *Deleted

## 2020-05-01 DIAGNOSIS — E039 Hypothyroidism, unspecified: Secondary | ICD-10-CM

## 2020-05-01 DIAGNOSIS — O99280 Endocrine, nutritional and metabolic diseases complicating pregnancy, unspecified trimester: Secondary | ICD-10-CM | POA: Insufficient documentation

## 2020-05-01 DIAGNOSIS — Z3A23 23 weeks gestation of pregnancy: Secondary | ICD-10-CM

## 2020-05-01 DIAGNOSIS — Z3480 Encounter for supervision of other normal pregnancy, unspecified trimester: Secondary | ICD-10-CM | POA: Insufficient documentation

## 2020-05-01 DIAGNOSIS — O99282 Endocrine, nutritional and metabolic diseases complicating pregnancy, second trimester: Secondary | ICD-10-CM | POA: Diagnosis not present

## 2020-05-01 DIAGNOSIS — O321XX0 Maternal care for breech presentation, not applicable or unspecified: Secondary | ICD-10-CM

## 2020-05-01 DIAGNOSIS — Z363 Encounter for antenatal screening for malformations: Secondary | ICD-10-CM | POA: Diagnosis not present

## 2020-05-06 ENCOUNTER — Other Ambulatory Visit: Payer: Self-pay

## 2020-05-06 MED ORDER — LEVOTHYROXINE SODIUM 125 MCG PO TABS
125.0000 ug | ORAL_TABLET | Freq: Every day | ORAL | 0 refills | Status: DC
Start: 2020-05-06 — End: 2020-05-08

## 2020-05-07 ENCOUNTER — Other Ambulatory Visit: Payer: Self-pay | Admitting: Family Medicine

## 2020-05-14 ENCOUNTER — Other Ambulatory Visit: Payer: Self-pay

## 2020-05-14 ENCOUNTER — Ambulatory Visit (INDEPENDENT_AMBULATORY_CARE_PROVIDER_SITE_OTHER): Payer: 59 | Admitting: Family Medicine

## 2020-05-14 VITALS — BP 113/57 | HR 72 | Wt 179.0 lb

## 2020-05-14 DIAGNOSIS — Z3480 Encounter for supervision of other normal pregnancy, unspecified trimester: Secondary | ICD-10-CM

## 2020-05-14 DIAGNOSIS — Z3A25 25 weeks gestation of pregnancy: Secondary | ICD-10-CM

## 2020-05-14 DIAGNOSIS — E039 Hypothyroidism, unspecified: Secondary | ICD-10-CM

## 2020-05-14 NOTE — Progress Notes (Signed)
   PRENATAL VISIT NOTE  Subjective:  Michele Martinez is a 35 y.o. G2P1001 at [redacted]w[redacted]d being seen today for ongoing prenatal care.  She is currently monitored for the following issues for this high-risk pregnancy and has Hypothyroidism; Supervision of other normal pregnancy, antepartum; and Mild intermittent asthma on their problem list.  Patient reports no complaints.  Contractions: Not present. Vag. Bleeding: None.  Movement: Present. Denies leaking of fluid.   The following portions of the patient's history were reviewed and updated as appropriate: allergies, current medications, past family history, past medical history, past social history, past surgical history and problem list.   Objective:   Vitals:   05/14/20 1106  BP: (!) 113/57  Pulse: 72  Weight: 179 lb (81.2 kg)    Fetal Status: Fetal Heart Rate (bpm): 140 Fundal Height: 26 cm Movement: Present     General:  Alert, oriented and cooperative. Patient is in no acute distress.  Skin: Skin is warm and dry. No rash noted.   Cardiovascular: Normal heart rate noted  Respiratory: Normal respiratory effort, no problems with respiration noted  Abdomen: Soft, gravid, appropriate for gestational age.  Pain/Pressure: Absent     Pelvic: Cervical exam deferred        Extremities: Normal range of motion.  Edema: None  Mental Status: Normal mood and affect. Normal behavior. Normal judgment and thought content.   Assessment and Plan:  Pregnancy: G2P1001 at [redacted]w[redacted]d 1. [redacted] weeks gestation of pregnancy  2. Supervision of other normal pregnancy, antepartum FHT and FH normal  3. Hypothyroidism, unspecified type Last TSH a little suppressed.  Recheck in 2 weeks with 28 week labs.  Preterm labor symptoms and general obstetric precautions including but not limited to vaginal bleeding, contractions, leaking of fluid and fetal movement were reviewed in detail with the patient. Please refer to After Visit Summary for other counseling recommendations.    No follow-ups on file.  Future Appointments  Date Time Provider Department Center  06/03/2020  8:30 AM Aviva Signs, CNM [facility] None  06/05/2020 11:00 AM [facility] NURSE [facility]  06/05/2020 11:15 AM [facility] US2 [facility]  06/17/2020 11:15 AM Rasch, Harolyn Rutherford, NP [facility] None  07/01/2020 11:15 AM Aviva Signs, CNM [facility] None    Levie Heritage, DO

## 2020-06-03 ENCOUNTER — Encounter: Payer: Self-pay | Admitting: General Practice

## 2020-06-03 ENCOUNTER — Ambulatory Visit (INDEPENDENT_AMBULATORY_CARE_PROVIDER_SITE_OTHER): Payer: 59 | Admitting: Advanced Practice Midwife

## 2020-06-03 ENCOUNTER — Encounter: Payer: Self-pay | Admitting: Advanced Practice Midwife

## 2020-06-03 ENCOUNTER — Other Ambulatory Visit: Payer: Self-pay

## 2020-06-03 VITALS — BP 98/52 | HR 75 | Wt 184.0 lb

## 2020-06-03 DIAGNOSIS — Z3480 Encounter for supervision of other normal pregnancy, unspecified trimester: Secondary | ICD-10-CM

## 2020-06-03 DIAGNOSIS — E039 Hypothyroidism, unspecified: Secondary | ICD-10-CM

## 2020-06-03 DIAGNOSIS — Z3A28 28 weeks gestation of pregnancy: Secondary | ICD-10-CM

## 2020-06-03 NOTE — Patient Instructions (Signed)
 Glucose Tolerance Test Why am I having this test? The glucose tolerance test (GTT) is done to check how your body processes sugar (glucose). This is one of several tests used to diagnose diabetes (diabetes mellitus). Your health care provider may recommend this test if you:  Have a family history of diabetes.  Are obese.  Have infections that keep coming back.  Have had a lot of wounds that did not heal quickly, especially on your legs and feet.  Are a woman and have a history of giving birth to very large babies or a history of repeated fetal loss (stillbirth).  Have had high glucose levels in your urine or blood: ? During a past pregnancy. ? After a heart attack, surgery, or prolonged periods of high stress. What is being tested? This test measures the amount of glucose in your blood at different times during a period of 2 hours. This indicates how well your body is able to process glucose. What kind of sample is taken? Blood samples are required for this test. They are usually collected by inserting a needle into a blood vessel.   How do I prepare for this test?  For 3 days before your test, eat normally. Have plenty of carbohydrate-rich foods.  Follow instructions from your health care provider about: ? Eating or drinking restrictions on the day of the test. You may be asked to not eat or drink anything other than water (fast) starting 8-12 hours before the test. ? Changing or stopping your regular medicines. Some medicines may interfere with this test. Tell a health care provider about:  All medicines you are taking, including vitamins, herbs, eye drops, creams, and over-the-counter medicines.  Any blood disorders you have.  Any surgeries you have had.  Any medical conditions you have.  Whether you are pregnant or may be pregnant. What happens during the test? First, your blood glucose will be measured. This is referred to as your fasting blood glucose, since you  fasted before the test. Then, you will drink a glucose solution that contains a specific amount of glucose. Your blood glucose will be measured again 1 and 2 hours after drinking the solution. This test takes 2 hours to complete. You will need to stay at the testing location during this time. During the testing period:  Do not eat or drink anything other than the glucose solution. You will be allowed to drink water.  Do not exercise.  Do not use any products that contain nicotine or tobacco. These products include cigarettes, chewing tobacco, and vaping devices, such as e-cigarettes. If you need help quitting, ask your health care provider. The testing procedure may vary among health care providers and hospitals. How are the results reported? Your test results will be reported as values. These will be given as milligrams of glucose per deciliter of blood (mg/dL) or millimoles per liter (mmol/L). Your health care provider will compare your results to normal ranges that were established after testing a large group of people (reference ranges). Reference ranges may vary among labs and hospitals. For this test, common reference ranges are:  Fasting: less than 110 mg/dL (6.1 mmol/L).  1 hour after drinking glucose: less than 180 mg/dL (10.0 mmol/L).  2 hours after drinking glucose: less than 140 mg/dL (7.8 mmol/L). What do the results mean? Results that are within the reference ranges are considered normal, meaning that your glucose levels are well controlled. Results higher than the reference ranges may mean that you recently experienced   stress, such as from an injury or a sudden (acute) condition like a heart attack or stroke, or that you have:  Diabetes.  Cushing syndrome.  Tumors such as pheochromocytoma or glucagonoma.  Kidney failure.  Pancreatitis.  Hyperthyroidism.  An infection. Talk with your health care provider about what your results mean. Questions to ask your health care  provider Ask your health care provider, or the department that is doing the test:  When will my results be ready?  How will I get my results?  What are my treatment options?  What other tests do I need?  What are my next steps? Summary  The GTT is done to check how your body processes glucose. This is one of several tests used to diagnose diabetes.  This test measures the amount of glucose in your blood at different times during a period of 2 hours. This indicates how well your body is able to process glucose.  Talk with your health care provider about what your results mean. This information is not intended to replace advice given to you by your health care provider. Make sure you discuss any questions you have with your health care provider. Document Revised: 10/17/2019 Document Reviewed: 10/17/2019 Elsevier Patient Education  2021 Elsevier Inc.  

## 2020-06-03 NOTE — Progress Notes (Addendum)
   PRENATAL VISIT NOTE  Subjective:  Michele Martinez is a 35 y.o. G2P1001 at [redacted]w[redacted]d being seen today for ongoing prenatal care.  She is currently monitored for the following issues for this high-risk pregnancy and has Hypothyroidism; Supervision of other normal pregnancy, antepartum; and Mild intermittent asthma on their problem list.  Patient reports no complaints.  Contractions: Not present. Vag. Bleeding: None.  Movement: Present. Denies leaking of fluid.   The following portions of the patient's history were reviewed and updated as appropriate: allergies, current medications, past family history, past medical history, past social history, past surgical history and problem list.   Objective:   Vitals:   06/03/20 0836  BP: (!) 98/52  Pulse: 75  Weight: 184 lb (83.5 kg)    Fetal Status: Fetal Heart Rate (bpm): 142   Movement: Present     General:  Alert, oriented and cooperative. Patient is in no acute distress.  Skin: Skin is warm and dry. No rash noted.   Cardiovascular: Normal heart rate noted  Respiratory: Normal respiratory effort, no problems with respiration noted  Abdomen: Soft, gravid, appropriate for gestational age.  Pain/Pressure: Absent     Pelvic: Cervical exam deferred        Extremities: Normal range of motion.  Edema: None  Mental Status: Normal mood and affect. Normal behavior. Normal judgment and thought content.   Assessment and Plan:  Pregnancy: G2P1001 at [redacted]w[redacted]d 1. [redacted] weeks gestation of pregnancy      Worried about weight gain, reassured FH is measureing S=D - CBC - Glucose Tolerance, 2 Hours w/1 Hour - HIV Antibody (routine testing w rflx) - RPR  2. Supervision of other normal pregnancy, antepartum      Prefers [place]     Did class (forgot certificate)       Reviewed pertinent issues     Consent signed  3. Hypothyroidism, unspecified type     Will check TSH today - TSH  Preterm labor symptoms and general obstetric precautions including but not  limited to vaginal bleeding, contractions, leaking of fluid and fetal movement were reviewed in detail with the patient. Please refer to After Visit Summary for other counseling recommendations.   Return in about 2 weeks (around 06/17/2020) for [facility].  Future Appointments  Date Time Provider Department Center  06/05/2020 11:00 AM [facility] NURSE [facility]  06/05/2020 11:15 AM [facility] US2 [facility]  06/17/2020 11:15 AM Rasch, Harolyn Rutherford, NP [facility] None  07/01/2020 11:15 AM Aviva Signs, CNM [facility] None    Wynelle Bourgeois, CNM

## 2020-06-04 LAB — GLUCOSE TOLERANCE, 2 HOURS W/ 1HR
Glucose, 1 hour: 86 mg/dL (ref 65–179)
Glucose, 2 hour: 96 mg/dL (ref 65–152)
Glucose, Fasting: 73 mg/dL (ref 65–91)

## 2020-06-04 LAB — CBC
Hematocrit: 33.4 % — ABNORMAL LOW (ref 34.0–46.6)
Hemoglobin: 11.2 g/dL (ref 11.1–15.9)
MCH: 29.4 pg (ref 26.6–33.0)
MCHC: 33.5 g/dL (ref 31.5–35.7)
MCV: 88 fL (ref 79–97)
Platelets: 235 10*3/uL (ref 150–450)
RBC: 3.81 x10E6/uL (ref 3.77–5.28)
RDW: 12.4 % (ref 11.7–15.4)
WBC: 9.5 10*3/uL (ref 3.4–10.8)

## 2020-06-04 LAB — RPR: RPR Ser Ql: NONREACTIVE

## 2020-06-04 LAB — T3: T3, Total: 206 ng/dL — ABNORMAL HIGH (ref 71–180)

## 2020-06-04 LAB — HIV ANTIBODY (ROUTINE TESTING W REFLEX): HIV Screen 4th Generation wRfx: NONREACTIVE

## 2020-06-04 LAB — T4, FREE: Free T4: 1.15 ng/dL (ref 0.82–1.77)

## 2020-06-05 ENCOUNTER — Ambulatory Visit: Payer: 59 | Admitting: *Deleted

## 2020-06-05 ENCOUNTER — Other Ambulatory Visit: Payer: Self-pay

## 2020-06-05 ENCOUNTER — Other Ambulatory Visit: Payer: Self-pay | Admitting: Obstetrics

## 2020-06-05 ENCOUNTER — Ambulatory Visit: Payer: 59 | Attending: Maternal & Fetal Medicine

## 2020-06-05 ENCOUNTER — Encounter: Payer: Self-pay | Admitting: *Deleted

## 2020-06-05 DIAGNOSIS — Z362 Encounter for other antenatal screening follow-up: Secondary | ICD-10-CM

## 2020-06-05 DIAGNOSIS — E039 Hypothyroidism, unspecified: Secondary | ICD-10-CM | POA: Diagnosis not present

## 2020-06-05 DIAGNOSIS — Z3480 Encounter for supervision of other normal pregnancy, unspecified trimester: Secondary | ICD-10-CM | POA: Insufficient documentation

## 2020-06-05 DIAGNOSIS — Z3A28 28 weeks gestation of pregnancy: Secondary | ICD-10-CM

## 2020-06-05 DIAGNOSIS — O99283 Endocrine, nutritional and metabolic diseases complicating pregnancy, third trimester: Secondary | ICD-10-CM

## 2020-06-05 DIAGNOSIS — O99280 Endocrine, nutritional and metabolic diseases complicating pregnancy, unspecified trimester: Secondary | ICD-10-CM | POA: Diagnosis present

## 2020-06-10 ENCOUNTER — Telehealth: Payer: Self-pay

## 2020-06-10 NOTE — Telephone Encounter (Signed)
Spoke with [place] at [employer]. [place] states that today is the last day to add on lab. She states she will add the TSH to pt's labs to pt's order. Elmer Boutelle l [place], CMA

## 2020-06-10 NOTE — Telephone Encounter (Signed)
-----   Message from Aviva Signs, CNM sent at 06/04/2020  5:52 AM EDT ----- Regarding: check on tsh order Looks like they resulted T3 and T4 but TSH says "needs to be collected" Its the one I want the most  Can you check?  Hilda Lias

## 2020-06-17 ENCOUNTER — Encounter: Payer: 59 | Admitting: Obstetrics and Gynecology

## 2020-07-01 ENCOUNTER — Ambulatory Visit (INDEPENDENT_AMBULATORY_CARE_PROVIDER_SITE_OTHER): Payer: 59 | Admitting: Advanced Practice Midwife

## 2020-07-01 ENCOUNTER — Other Ambulatory Visit: Payer: Self-pay

## 2020-07-01 VITALS — BP 107/64 | HR 82 | Wt 191.0 lb

## 2020-07-01 DIAGNOSIS — E039 Hypothyroidism, unspecified: Secondary | ICD-10-CM

## 2020-07-01 DIAGNOSIS — Z3A32 32 weeks gestation of pregnancy: Secondary | ICD-10-CM

## 2020-07-01 DIAGNOSIS — U071 COVID-19: Secondary | ICD-10-CM

## 2020-07-01 NOTE — Progress Notes (Signed)
   PRENATAL VISIT NOTE  Subjective:  Michele Martinez is a 35 y.o. G2P1001 at [redacted]w[redacted]d being seen today for ongoing prenatal care.  She is currently monitored for the following issues for this low-risk pregnancy and has Hypothyroidism; Supervision of other normal pregnancy, antepartum; and Mild intermittent asthma on their problem list.  Patient reports Had covid recently, had asthma exacerbation so FP added Pulmicort.   Has WB class certificate.  Contractions: Irritability. Vag. Bleeding: None.  Movement: Present. Denies leaking of fluid.   The following portions of the patient's history were reviewed and updated as appropriate: allergies, current medications, past family history, past medical history, past social history, past surgical history and problem list.   Objective:   Vitals:   07/01/20 1128  BP: 107/64  Pulse: 82  Weight: 191 lb (86.6 kg)    Fetal Status:     Movement: Present     General:  Alert, oriented and cooperative. Patient is in no acute distress.  Skin: Skin is warm and dry. No rash noted.   Cardiovascular: Normal heart rate noted  Respiratory: Normal respiratory effort, no problems with respiration noted  Abdomen: Soft, gravid, appropriate for gestational age.  Pain/Pressure: Present     Pelvic: Cervical exam deferred        Extremities: Normal range of motion.  Edema: Trace  Mental Status: Normal mood and affect. Normal behavior. Normal judgment and thought content.   Assessment and Plan:  Pregnancy: G2P1001 at [redacted]w[redacted]d 1. [redacted] weeks gestation of pregnancy    Planning waterbirth    Reviewed issues  2. Hypothyroidism, unspecified type     TSH not resulted from last draw, RN will check with lab on that or redraw  3. COVID    Recovered but had to add Pulmicort to her inhaler regime (by primary care doctor)    Doing well otherwise, just tired  Preterm labor symptoms and general obstetric precautions including but not limited to vaginal bleeding, contractions, leaking  of fluid and fetal movement were reviewed in detail with the patient. Please refer to After Visit Summary for other counseling recommendations.   No follow-ups on file.  Future Appointments  Date Time Provider Department Center  07/15/2020 11:15 AM Aviva Signs, CNM [facility] None  07/17/2020 11:00 AM [facility] NURSE [facility]  07/17/2020 11:15 AM [facility] US2 [facility]  07/29/2020 11:15 AM Aviva Signs, CNM [facility] None  08/05/2020 10:35 AM Aviva Signs, CNM [facility] None  08/12/2020 10:15 AM Aviva Signs, CNM [facility] None  08/19/2020 10:55 AM Aviva Signs, CNM [facility] None    Wynelle Bourgeois, CNM

## 2020-07-02 ENCOUNTER — Encounter: Payer: Self-pay | Admitting: General Practice

## 2020-07-10 ENCOUNTER — Ambulatory Visit: Payer: 59

## 2020-07-14 ENCOUNTER — Other Ambulatory Visit: Payer: Self-pay | Admitting: Family Medicine

## 2020-07-15 ENCOUNTER — Encounter: Payer: 59 | Admitting: Advanced Practice Midwife

## 2020-07-17 ENCOUNTER — Ambulatory Visit: Payer: 59

## 2020-07-29 ENCOUNTER — Other Ambulatory Visit (HOSPITAL_COMMUNITY)
Admission: RE | Admit: 2020-07-29 | Discharge: 2020-07-29 | Disposition: A | Discharge status: Home / Self Care | Payer: 59 | Source: Ambulatory Visit | Attending: Advanced Practice Midwife | Admitting: Advanced Practice Midwife

## 2020-07-29 ENCOUNTER — Ambulatory Visit (INDEPENDENT_AMBULATORY_CARE_PROVIDER_SITE_OTHER): Payer: 59 | Admitting: Advanced Practice Midwife

## 2020-07-29 ENCOUNTER — Other Ambulatory Visit: Payer: Self-pay

## 2020-07-29 ENCOUNTER — Encounter: Payer: Self-pay | Admitting: Advanced Practice Midwife

## 2020-07-29 VITALS — BP 109/67 | HR 86 | Wt 195.0 lb

## 2020-07-29 DIAGNOSIS — Z3A36 36 weeks gestation of pregnancy: Secondary | ICD-10-CM | POA: Insufficient documentation

## 2020-07-29 DIAGNOSIS — Z3403 Encounter for supervision of normal first pregnancy, third trimester: Secondary | ICD-10-CM | POA: Diagnosis present

## 2020-07-29 DIAGNOSIS — I10 Essential (primary) hypertension: Secondary | ICD-10-CM

## 2020-07-29 DIAGNOSIS — E039 Hypothyroidism, unspecified: Secondary | ICD-10-CM

## 2020-07-29 NOTE — Progress Notes (Addendum)
   PRENATAL VISIT NOTE  Subjective:  Michele Martinez is a 35 y.o. G2P1001 at [redacted]w[redacted]d being seen today for ongoing prenatal care.  She is currently monitored for the following issues for this low-risk pregnancy and has Hypothyroidism; Supervision of other normal pregnancy, antepartum; and Mild intermittent asthma on their problem list.  Patient reports no complaints.  Contractions: Not present. Vag. Bleeding: None.  Movement: Present. Denies leaking of fluid.   The following portions of the patient's history were reviewed and updated as appropriate: allergies, current medications, past family history, past medical history, past social history, past surgical history and problem list.   Objective:   Vitals:   07/29/20 1123  BP: 109/67  Pulse: 86  Weight: 195 lb (88.5 kg)    Fetal Status: Fetal Heart Rate (bpm): 150 Fundal Height: 36 cm Movement: Present  Presentation: Vertex  General:  Alert, oriented and cooperative. Patient is in no acute distress.  Skin: Skin is warm and dry. No rash noted.   Cardiovascular: Normal heart rate noted  Respiratory: Normal respiratory effort, no problems with respiration noted  Abdomen: Soft, gravid, appropriate for gestational age.  Pain/Pressure: Present     Pelvic: Cervical exam performed in the presence of a chaperone Dilation: Closed Effacement (%): 30 Station: Ballotable  Extremities: Normal range of motion.  Edema: Trace  Mental Status: Normal mood and affect. Normal behavior. Normal judgment and thought content.   Assessment and Plan:  Pregnancy: G2P1001 at [redacted]w[redacted]d 1. [redacted] weeks gestation of pregnancy     Vertex     Labor signs reviewed - Culture, beta strep (group b only) - GC/Chlamydia probe amp ([place])not at [facility]  2. Hypothyroidism, unspecified type      TSH did not get run last time. T3 slightly elevated, T4 normal   Reordering it today.   - TSH  3. Benign essential hypertension     No record of who entered this as a problem   Patient  denies having a history or HTN     Has never been even borderline this pregnancy.  BPs low 100s/50-60s     Reviewed by Dr Shawnie Pons.  OK for Waterbirth  Term labor symptoms and general obstetric precautions including but not limited to vaginal bleeding, contractions, leaking of fluid and fetal movement were reviewed in detail with the patient. Please refer to After Visit Summary for other counseling recommendations.     Future Appointments  Date Time Provider Department Center  08/05/2020 10:35 AM Aviva Signs, CNM [facility] None  08/12/2020 10:15 AM Aviva Signs, CNM [facility] None  08/19/2020 10:55 AM Aviva Signs, CNM [facility] None    Wynelle Bourgeois, CNM

## 2020-07-30 LAB — TSH: TSH: 1.7 u[IU]/mL (ref 0.450–4.500)

## 2020-07-31 LAB — GC/CHLAMYDIA PROBE AMP (~~LOC~~) NOT AT ARMC
Chlamydia: NEGATIVE
Comment: NEGATIVE
Comment: NORMAL
Neisseria Gonorrhea: NEGATIVE

## 2020-08-02 LAB — CULTURE, BETA STREP (GROUP B ONLY): Strep Gp B Culture: NEGATIVE

## 2020-08-05 ENCOUNTER — Encounter: Payer: 59 | Admitting: Advanced Practice Midwife

## 2020-08-12 ENCOUNTER — Encounter: Payer: 59 | Admitting: Advanced Practice Midwife

## 2020-08-18 ENCOUNTER — Other Ambulatory Visit: Payer: Self-pay | Admitting: Family Medicine

## 2020-08-19 ENCOUNTER — Encounter (HOSPITAL_COMMUNITY): Payer: Self-pay | Admitting: Family Medicine

## 2020-08-19 ENCOUNTER — Other Ambulatory Visit: Payer: Self-pay

## 2020-08-19 ENCOUNTER — Inpatient Hospital Stay (HOSPITAL_COMMUNITY)
Admission: AD | Admit: 2020-08-19 | Discharge: 2020-08-21 | DRG: 807 | Disposition: A | Discharge status: Home / Self Care | Payer: 59 | Attending: Obstetrics & Gynecology | Admitting: Obstetrics & Gynecology

## 2020-08-19 ENCOUNTER — Ambulatory Visit (INDEPENDENT_AMBULATORY_CARE_PROVIDER_SITE_OTHER): Payer: 59

## 2020-08-19 VITALS — BP 115/61 | HR 81 | Wt 197.0 lb

## 2020-08-19 DIAGNOSIS — J45909 Unspecified asthma, uncomplicated: Secondary | ICD-10-CM | POA: Diagnosis present

## 2020-08-19 DIAGNOSIS — Z3480 Encounter for supervision of other normal pregnancy, unspecified trimester: Secondary | ICD-10-CM

## 2020-08-19 DIAGNOSIS — Z3A39 39 weeks gestation of pregnancy: Secondary | ICD-10-CM | POA: Diagnosis not present

## 2020-08-19 DIAGNOSIS — Z3A00 Weeks of gestation of pregnancy not specified: Secondary | ICD-10-CM

## 2020-08-19 DIAGNOSIS — O99284 Endocrine, nutritional and metabolic diseases complicating childbirth: Secondary | ICD-10-CM | POA: Diagnosis present

## 2020-08-19 DIAGNOSIS — O329XX0 Maternal care for malpresentation of fetus, unspecified, not applicable or unspecified: Secondary | ICD-10-CM | POA: Diagnosis not present

## 2020-08-19 DIAGNOSIS — O26893 Other specified pregnancy related conditions, third trimester: Secondary | ICD-10-CM | POA: Diagnosis present

## 2020-08-19 DIAGNOSIS — O4292 Full-term premature rupture of membranes, unspecified as to length of time between rupture and onset of labor: Principal | ICD-10-CM | POA: Diagnosis present

## 2020-08-19 DIAGNOSIS — E039 Hypothyroidism, unspecified: Secondary | ICD-10-CM | POA: Diagnosis present

## 2020-08-19 DIAGNOSIS — O4202 Full-term premature rupture of membranes, onset of labor within 24 hours of rupture: Secondary | ICD-10-CM | POA: Diagnosis not present

## 2020-08-19 DIAGNOSIS — O4290 Premature rupture of membranes, unspecified as to length of time between rupture and onset of labor, unspecified weeks of gestation: Secondary | ICD-10-CM | POA: Diagnosis present

## 2020-08-19 DIAGNOSIS — O9952 Diseases of the respiratory system complicating childbirth: Secondary | ICD-10-CM | POA: Diagnosis present

## 2020-08-19 DIAGNOSIS — Z3689 Encounter for other specified antenatal screening: Secondary | ICD-10-CM

## 2020-08-19 LAB — CBC
HCT: 34.7 % — ABNORMAL LOW (ref 36.0–46.0)
Hemoglobin: 11.4 g/dL — ABNORMAL LOW (ref 12.0–15.0)
MCH: 28.6 pg (ref 26.0–34.0)
MCHC: 32.9 g/dL (ref 30.0–36.0)
MCV: 87 fL (ref 80.0–100.0)
Platelets: 259 10*3/uL (ref 150–400)
RBC: 3.99 MIL/uL (ref 3.87–5.11)
RDW: 14 % (ref 11.5–15.5)
WBC: 9.3 10*3/uL (ref 4.0–10.5)
nRBC: 0 % (ref 0.0–0.2)

## 2020-08-19 LAB — POCT FERN TEST: POCT Fern Test: POSITIVE

## 2020-08-19 LAB — TYPE AND SCREEN
ABO/RH(D): A POS
Antibody Screen: NEGATIVE

## 2020-08-19 MED ORDER — OXYCODONE-ACETAMINOPHEN 5-325 MG PO TABS: 1.0000 | ORAL_TABLET | ORAL | Status: DC | PRN

## 2020-08-19 MED ORDER — ONDANSETRON HCL 4 MG/2ML IJ SOLN: 4.0000 mg | Freq: Four times a day (QID) | INTRAMUSCULAR | Status: DC | PRN

## 2020-08-19 MED ORDER — LACTATED RINGERS IV SOLN: 500.0000 mL | INTRAVENOUS | Status: DC | PRN

## 2020-08-19 MED ORDER — SOD CITRATE-CITRIC ACID 500-334 MG/5ML PO SOLN: 30.0000 mL | ORAL | Status: DC | PRN

## 2020-08-19 MED ORDER — ACETAMINOPHEN 325 MG PO TABS: 650.0000 mg | ORAL_TABLET | ORAL | Status: DC | PRN

## 2020-08-19 MED ORDER — LACTATED RINGERS IV SOLN: INTRAVENOUS | Status: DC

## 2020-08-19 MED ORDER — LEVOTHYROXINE SODIUM 75 MCG PO TABS
125.0000 ug | ORAL_TABLET | Freq: Every day | ORAL | Status: DC
  Administered 2020-08-20: 125 ug via ORAL
  Filled 2020-08-19: qty 1

## 2020-08-19 MED ORDER — OXYTOCIN-SODIUM CHLORIDE 30-0.9 UT/500ML-% IV SOLN
2.5000 [IU]/h | INTRAVENOUS | Status: DC
  Filled 2020-08-19: qty 500

## 2020-08-19 MED ORDER — LIDOCAINE HCL (PF) 1 % IJ SOLN
30.0000 mL | INTRAMUSCULAR | Status: AC | PRN
  Administered 2020-08-20: 30 mL via SUBCUTANEOUS
  Filled 2020-08-19: qty 30

## 2020-08-19 MED ORDER — FLEET ENEMA 7-19 GM/118ML RE ENEM: 1.0000 | ENEMA | Freq: Every day | RECTAL | Status: DC | PRN

## 2020-08-19 MED ORDER — OXYCODONE-ACETAMINOPHEN 5-325 MG PO TABS: 2.0000 | ORAL_TABLET | ORAL | Status: DC | PRN

## 2020-08-19 MED ORDER — OXYTOCIN BOLUS FROM INFUSION
333.0000 mL | Freq: Once | INTRAVENOUS | Status: AC
  Administered 2020-08-20: 333 mL via INTRAVENOUS

## 2020-08-19 MED ORDER — FENTANYL CITRATE (PF) 100 MCG/2ML IJ SOLN: 50.0000 ug | INTRAMUSCULAR | Status: DC | PRN

## 2020-08-19 MED ORDER — HYDROXYZINE HCL 50 MG PO TABS: 50.0000 mg | ORAL_TABLET | Freq: Four times a day (QID) | ORAL | Status: DC | PRN

## 2020-08-19 NOTE — H&P (Signed)
Michele Martinez is a 35 y.o. female presenting for ROM.  Patient noted ROM at home, clear fluid at about 6PM today. Patient was found to be grossly ruptured in MAU. Denies contractions. Denies VB. Desires waterbirth. Has completed class, signed consent, and seen midwives during her prenatal care. No barriers to waterbirth on admission.    Feeling baby move.    Hx significant for hypothyroidism on Synthroid.    [place] on 06/05/20 at [redacted]w[redacted]d with EFW 56%tile, AFI wnl, placenta anterior   OB History     Gravida  2   Para  1   Term  1   Preterm      AB      Living  1      SAB      IAB      Ectopic      Multiple      Live Births  1          Past Medical History:  Diagnosis Date   Asthma    Eczema    Hashimoto's disease    Thyroid disease    History reviewed. No pertinent surgical history. Family History: family history includes Healthy in her father and mother. Social History:  reports that she has never smoked. She has never used smokeless tobacco. She reports that she does not drink alcohol and does not use drugs.     Maternal Diabetes: No Genetic Screening: Declined Maternal Ultrasounds/Referrals: Normal Fetal Ultrasounds or other Referrals:  None Maternal Substance Abuse:  No Significant Maternal Medications:  Meds include: Syntroid Significant Maternal Lab Results:  Group B Strep negative Other Comments:  None  Review of Systems  Constitutional:  Negative for chills and fever.  Respiratory:  Negative for cough and shortness of breath.   Cardiovascular:  Negative for chest pain.  Gastrointestinal:  Negative for abdominal pain.  Genitourinary:        Leaking fluid  Skin:  Negative for rash.  Neurological:  Negative for headaches.  History   Blood pressure 124/65, pulse 89, temperature (!) 97.5 F (36.4 C), temperature source Oral, resp. rate 15, last menstrual period 11/17/2019, SpO2 97 %. Exam Physical Exam Constitutional:      General: She is not  in acute distress.    Appearance: She is well-developed.  HENT:     Head: Normocephalic and atraumatic.  Cardiovascular:     Rate and Rhythm: Normal rate.  Pulmonary:     Effort: Pulmonary effort is normal. No respiratory distress.  Abdominal:     Comments: gravid  Musculoskeletal:        General: No tenderness.  Skin:    General: Skin is warm and dry.     Findings: No erythema or rash.  Neurological:     Mental Status: She is alert and oriented to person, place, and time.    Prenatal labs: ABO, Rh: A/Positive/-- (12/21 1127) Antibody: Negative (12/21 1127) Rubella: 2.00 (12/21 1127) RPR: Non Reactive (05/03 0856)  HBsAg: Negative (12/21 1127)  HIV: Non Reactive (05/03 0856)  GBS: Negative/-- (06/28 1130)   Assessment/Plan: Michele Martinez is a 35 y.o. G2P1001 at [redacted]w[redacted]d by 9wk [place]. PROM with onset of early labor   #Labor:  Expectant management #Pain: May have epidural if she desires. #FWB:  Category I, FHR baseline 145 with moderate variability, positive accels, no decels. Contractions Q 7-8 minutes, mild to palpation. #ID:  GBS Neg  #MOF: Breast #MOC:  undecided  Sharen Counter, CNM 08/19/2020, 8:24 PM

## 2020-08-19 NOTE — Progress Notes (Signed)
   LOW-RISK PREGNANCY OFFICE VISIT  Patient name: Michele Martinez MRN [id number]  Date of birth: 1985/07/04 Chief Complaint:   Routine Prenatal Visit  Subjective:   Michele Martinez is a 35 y.o. G28P1001 female at [redacted]w[redacted]d with an Estimated Date of Delivery: 08/23/20 being seen today for ongoing management of a low-risk pregnancy aeb has Hypothyroidism; Supervision of other normal pregnancy, antepartum; and Mild intermittent asthma on their problem list.  Patient presents today with  spotting .  She reports she has noticed some spotting this morning, but had sex in the past 72 hours.  She denies contractions or abdominal cramping. Patient endorses fetal movement. Patient denies abdominal cramping or contractions.  Patient denies vaginal concerns including abnormal discharge and leaking of fluid.  Contractions: Irregular. Vag. Bleeding: Scant.  Movement: Present.  She denies issues or discomfort with urination. Patient reports her last baby was born at 87 weeks and labor was induced with membrane sweeping.   Reviewed past medical,surgical, social, obstetrical and family history as well as problem list, medications and allergies.  Objective   Vitals:   08/19/20 1054  BP: 115/61  Pulse: 81  Weight: 197 lb (89.4 kg)  Body mass index is 36.03 kg/m.  Total Weight Gain:37 lb (16.8 kg)         Physical Examination:   General appearance: Well appearing, and in no distress  Mental status: Alert, oriented to person, place, and time  Skin: Warm & dry  Cardiovascular: Normal heart rate noted  Respiratory: Normal respiratory effort, no distress  Abdomen: Soft, gravid, nontender, AGA with Fundal Height: 37 cm  Pelvic: Cervical exam deferred           Extremities: Edema: Trace  Fetal Status: Fetal Heart Rate (bpm): 143  Movement: Present   No results found for this or any previous visit (from the past 24 hour(s)).  Assessment & Plan:  Low-risk pregnancy of a 35 y.o., G2P1001 at [redacted]w[redacted]d with an Estimated  Date of Delivery: 08/23/20   1. Supervision of other normal pregnancy, antepartum -Anticipatory guidance for upcoming appts. -Patient to schedule next appt in 1 weeks for an in-person visit. -Informed that next appt will also include NST because of Postdates status. -Reviewed where to go for Labor onset or pregnancy emergencies.   2. [redacted] weeks gestation of pregnancy -Doing well. -Extensive discussion regarding IOL and WB.  Informed that they are not mutually exclusive.  -Discussed how in some incidents induction procedures can be implemented and labor progresses without further intervention. -Also cautioned that this is not always the case. -Informed that she will be scheduled for IOL at 41 weeks and most [facility] MDs do not support pregnancy beyond 42 weeks. -Also reiterated WB exclusion criteria.    Meds: No orders of the defined types were placed in this encounter.  Labs/procedures today:  Lab Orders  No laboratory test(s) ordered today     Reviewed: Term labor symptoms and general obstetric precautions including but not limited to vaginal bleeding, contractions, leaking of fluid and fetal movement were reviewed in detail with the patient.  All questions were answered.  Follow-up: Return in about 1 week (around 08/26/2020) for LROB with NST for Postdates.  No orders of the defined types were placed in this encounter.  Cherre Robins MSN, CNM 08/19/2020

## 2020-08-19 NOTE — MAU Note (Signed)
.  Michele Martinez is a 35 y.o. at [redacted]w[redacted]d here in MAU reporting: Possible SROM at 1800. States there was a gush of clear fluid that is still leaking out. States she is having irregular ctx and pelvic pressure. Also reporting some bloody show. Endorses good fetal movement.   Pain score: 0 Vitals:   08/19/20 1858  BP: 124/65  Pulse: 84  Resp: 15  Temp: (!) 97.5 F (36.4 C)  SpO2: 97%

## 2020-08-19 NOTE — Progress Notes (Signed)
Pt informed that the ultrasound is considered a limited OB ultrasound and is not intended to be a complete ultrasound exam.  Patient also informed that the ultrasound is not being completed with the intent of assessing for fetal or placental anomalies or any pelvic abnormalities.  Explained that the purpose of today's ultrasound is to assess for  presentation.  Patient acknowledges the purpose of the exam and the limitations of the study.    Cephalic  Emre Stock, NP   

## 2020-08-19 NOTE — Progress Notes (Addendum)
Labor Progress Note Cassara Nida is a 35 y.o. G2P1001 at [redacted]w[redacted]d presented for ROM@1800 .   S: Patient is resting comfortably with FOB at bedside.  O:  BP 99/60   Pulse 79   Temp 98.4 F (36.9 C) (Oral)   Resp 15   LMP 11/17/2019   SpO2 97%  EFM: baseline 130 BPM/good variability/+accels/-decels Toco: Contractions intermittent q6-10 min  CVE: Dilation: 3 Effacement (%): 50, 20 Station: -3 Presentation: Vertex Exam by:: Dr. Germaine Pomfret   A&P: 35 y.o. G2P1001 [redacted]w[redacted]d presenting for ROM@1800 . #Labor: Pt is 3cm so we will continue letting her labor progress. Wants waterbirth.  #Pain: PRN #FWB: cat 1 #ID:  GBS Neg  #MOF: Breast #MOC:  undecided #Hypothyroidism: Synthroid starting tomorrow   [name], MD [facility] [employer], [facility] 11:19 PM

## 2020-08-20 ENCOUNTER — Encounter (HOSPITAL_COMMUNITY): Payer: Self-pay | Admitting: Family Medicine

## 2020-08-20 DIAGNOSIS — O4202 Full-term premature rupture of membranes, onset of labor within 24 hours of rupture: Secondary | ICD-10-CM

## 2020-08-20 DIAGNOSIS — O99284 Endocrine, nutritional and metabolic diseases complicating childbirth: Secondary | ICD-10-CM

## 2020-08-20 DIAGNOSIS — Z3A39 39 weeks gestation of pregnancy: Secondary | ICD-10-CM

## 2020-08-20 LAB — RPR: RPR Ser Ql: NONREACTIVE

## 2020-08-20 MED ORDER — TETANUS-DIPHTH-ACELL PERTUSSIS 5-2.5-18.5 LF-MCG/0.5 IM SUSY: 0.5000 mL | PREFILLED_SYRINGE | Freq: Once | INTRAMUSCULAR | Status: DC

## 2020-08-20 MED ORDER — WITCH HAZEL-GLYCERIN EX PADS: 1.0000 "application " | MEDICATED_PAD | CUTANEOUS | Status: DC | PRN

## 2020-08-20 MED ORDER — PRENATAL MULTIVITAMIN CH
1.0000 | ORAL_TABLET | Freq: Every day | ORAL | Status: DC
  Administered 2020-08-20: 1 via ORAL
  Filled 2020-08-20: qty 1

## 2020-08-20 MED ORDER — COCONUT OIL OIL: 1.0000 "application " | TOPICAL_OIL | Status: DC | PRN

## 2020-08-20 MED ORDER — LEVOTHYROXINE SODIUM 75 MCG PO TABS
125.0000 ug | ORAL_TABLET | Freq: Every day | ORAL | Status: DC
  Administered 2020-08-21: 125 ug via ORAL
  Filled 2020-08-20: qty 1

## 2020-08-20 MED ORDER — DIBUCAINE (PERIANAL) 1 % EX OINT: 1.0000 "application " | TOPICAL_OINTMENT | CUTANEOUS | Status: DC | PRN

## 2020-08-20 MED ORDER — MEASLES, MUMPS & RUBELLA VAC IJ SOLR: 0.5000 mL | Freq: Once | INTRAMUSCULAR | Status: DC

## 2020-08-20 MED ORDER — DIPHENHYDRAMINE HCL 25 MG PO CAPS: 25.0000 mg | ORAL_CAPSULE | Freq: Four times a day (QID) | ORAL | Status: DC | PRN

## 2020-08-20 MED ORDER — ACETAMINOPHEN 325 MG PO TABS: 650.0000 mg | ORAL_TABLET | ORAL | Status: DC | PRN

## 2020-08-20 MED ORDER — BENZOCAINE-MENTHOL 20-0.5 % EX AERO: 1.0000 "application " | INHALATION_SPRAY | CUTANEOUS | Status: DC | PRN

## 2020-08-20 MED ORDER — IBUPROFEN 600 MG PO TABS
600.0000 mg | ORAL_TABLET | Freq: Four times a day (QID) | ORAL | Status: DC
  Administered 2020-08-20: 600 mg via ORAL
  Filled 2020-08-20 (×3): qty 1

## 2020-08-20 MED ORDER — MISOPROSTOL 50MCG HALF TABLET
50.0000 ug | ORAL_TABLET | Freq: Once | ORAL | Status: AC
  Administered 2020-08-20: 50 ug via BUCCAL
  Filled 2020-08-20: qty 1

## 2020-08-20 MED ORDER — SENNOSIDES-DOCUSATE SODIUM 8.6-50 MG PO TABS: 2.0000 | ORAL_TABLET | ORAL | Status: DC

## 2020-08-20 MED ORDER — SIMETHICONE 80 MG PO CHEW
80.0000 mg | CHEWABLE_TABLET | ORAL | Status: DC | PRN
Start: 2020-08-20 — End: 2020-08-21

## 2020-08-20 MED ORDER — ONDANSETRON HCL 4 MG PO TABS: 4.0000 mg | ORAL_TABLET | ORAL | Status: DC | PRN

## 2020-08-20 MED ORDER — ONDANSETRON HCL 4 MG/2ML IJ SOLN: 4.0000 mg | INTRAMUSCULAR | Status: DC | PRN

## 2020-08-20 NOTE — Progress Notes (Addendum)
Labor Progress Note Elianny Buxbaum is a 35 y.o. G2P1001 at [redacted]w[redacted]d presented for PROM@ 1800.  S: Patient is resting comfortably. States contractions are becoming more severe.   O:  BP 99/60   Pulse 79   Temp (!) 97.4 F (36.3 C) (Oral)   Resp 15   LMP 11/17/2019   SpO2 97%  EFM: baseline 130 BPM/good variability/+accels/-decels  Toco: contractions   CVE: Dilation: 3.5 Effacement (%): 50 Cervical Position: Posterior Station: -3 Presentation: Vertex Exam by:: Gearldine Bienenstock, RN   A&P: 35 y.o. G2P1001 [redacted]w[redacted]d presented for PROM@ 1800.  #Labor: Cytotec ordered for the pt. If pt does not continue to progress on next check, will consider pit.  #Pain: prn #FWB: cat 1 #GBS negative    [name], MD [facility] [employer], [facility] 2:40 AM

## 2020-08-20 NOTE — Lactation Note (Signed)
This note was copied from a baby's chart. Lactation Consultation Note  Patient Name: Michele Martinez KSKSH'N Date: 08/20/2020   Age:35 hours   LC Note:  Per note, mother does not desire a lactation consult.   Maternal Data    Feeding Mother's Current Feeding Choice: Breast Milk  LATCH Score                    Lactation Tools Discussed/Used    Interventions    Discharge    Consult Status      [name] 08/20/2020, 2:23 PM

## 2020-08-20 NOTE — Discharge Summary (Signed)
Postpartum Discharge Summary  Date of Service updated 08/21/20     Patient Name: Michele Martinez DOB: 1986-01-06 MRN: [id number]  Date of admission: 08/19/2020 Delivery date:08/20/2020  Delivering provider: Julianne Handler  Date of discharge: 08/21/2020  Admitting diagnosis: PROM (premature rupture of membranes) [O42.90] Intrauterine pregnancy: [redacted]w[redacted]d    Secondary diagnosis:  Principal Problem:   Supervision of other normal pregnancy, antepartum Active Problems:   Hypothyroidism   Premature rupture of membranes   PROM (premature rupture of membranes)   SVD (spontaneous vaginal delivery)  Additional problems: asthma    Discharge diagnosis: Term Pregnancy Delivered                                              Post partum procedures: none Augmentation: Cytotec Complications: None  Hospital course: Onset of Labor With Vaginal Delivery      35y.o. yo [id number] admitted in Latent Labor on 08/19/2020. Patient had an uncomplicated labor course as follows:  Membrane Rupture Time/Date: 6:00 PM ,08/19/2020   Delivery Method:Vaginal, Spontaneous  Episiotomy: None  Lacerations:  2nd degree;Perineal  Patient had an uncomplicated postpartum course.  She is ambulating, tolerating a regular diet, passing flatus, and urinating well. Patient is discharged home in stable condition on 08/21/20.  Newborn Data: Birth date:08/20/2020  Birth time:10:14 AM  Gender:Female  Living status:Living  Apgars:9 ,10  Weight:3230 g   Magnesium Sulfate received: No BMZ received: No Rhophylac:N/A MMR:N/A T-DaP: declined Flu: No Transfusion:No  Physical exam  Vitals:   08/20/20 1708 08/20/20 2145 08/21/20 0208 08/21/20 0554  BP: 120/65 113/70 (!) 96/55 116/82  Pulse: 78 75 61 71  Resp: _0 Temp: (!) 97.4 F (36.3 C) 98.1 F (36.7 C) 97.8 F (36.6 C) 98.1 F (36.7 C)  TempSrc: Oral Oral Oral Oral  SpO2:  98% 98% 97%  Weight:      Height:       General: alert, cooperative,  and no distress Lochia: appropriate Uterine Fundus: firm Incision: N/A DVT Evaluation: No evidence of DVT seen on physical exam. Labs: Lab Results  Component Value Date   WBC 9.3 08/19/2020   HGB 11.4 (L) 08/19/2020   HCT 34.7 (L) 08/19/2020   MCV 87.0 08/19/2020   PLT 259 08/19/2020   CMP Latest Ref Rng & Units 01/22/2020  Glucose 65 - 99 mg/dL 74  BUN 6 - 20 mg/dL 8  Creatinine 0.57 - 1.00 mg/dL 0.48(L)  Sodium 134 - 144 mmol/L 135  Potassium 3.5 - 5.2 mmol/L 4.2  Chloride 96 - 106 mmol/L 100  CO2 20 - 29 mmol/L 18(L)  Calcium 8.7 - 10.2 mg/dL 9.7  Total Protein 6.0 - 8.5 g/dL 7.4  Total Bilirubin 0.0 - 1.2 mg/dL 0.2  Alkaline Phos 44 - 121 IU/L 43(L)  AST 0 - 40 IU/L 17  ALT 0 - 32 IU/L 11   Edinburgh Score: Edinburgh Postnatal Depression Scale Screening Tool 08/20/2020  I have been able to laugh and see the funny side of things. 0  I have looked forward with enjoyment to things. 0  I have blamed myself unnecessarily when things went wrong. 1  I have been anxious or worried for no good reason. 0  I have felt scared or panicky for no good reason. 0  Things have been getting on top of me. 0  I have been so unhappy that I have had difficulty sleeping. 0  I have felt sad or miserable. 0  I have been so unhappy that I have been crying. 0  The thought of harming myself has occurred to me. 0  Edinburgh Postnatal Depression Scale Total 1     After visit meds:  Allergies as of 08/21/2020   No Known Allergies      Medication List     STOP taking these medications    Pfizer-BioNTech COVID-19 Vacc 30 MCG/0.3ML injection Generic drug: COVID-19 mRNA vaccine (Pfizer)       TAKE these medications    acetaminophen 500 MG tablet Commonly known as: TYLENOL Take 2 tablets (1,000 mg total) by mouth every 6 (six) hours as needed (for pain scale < 4).   coconut oil Oil Apply 1 application topically as needed.   ibuprofen 600 MG tablet Commonly known as: ADVIL Take 1  tablet (600 mg total) by mouth every 6 (six) hours as needed.   levothyroxine 125 MCG tablet Commonly known as: SYNTHROID TAKE ONE TABLET BY MOUTH DAILY WITH BREAKFAST   PRENATAL VITAMINS PO Take by mouth.   Pulmicort Flexhaler 90 MCG/ACT inhaler Generic drug: Budesonide 1 puff   Pulmicort Flexhaler 90 MCG/ACT inhaler Generic drug: Budesonide Inhale into the lungs.       ASK your doctor about these medications    albuterol 108 (90 Base) MCG/ACT inhaler Commonly known as: VENTOLIN HFA Inhale into the lungs every 6 (six) hours as needed for wheezing or shortness of breath.   triamcinolone cream 0.1 % Commonly known as: KENALOG 1 application         Discharge home in stable condition Infant Feeding: Breast Infant Disposition:home with mother Discharge instruction: per After Visit Summary and Postpartum booklet. Activity: Advance as tolerated. Pelvic rest for 6 weeks.  Diet: routine diet Future Appointments: Future Appointments  Date Time Provider [place]  09/30/2020 10:10 AM Rasch, Artist Pais, NP [facility] None   Follow up Visit:   Please schedule this patient for a In person postpartum visit in 6 weeks with the following provider: Any provider. Additional Postpartum F/U: n/a   Low risk pregnancy complicated by:  n/a Delivery mode:  Vaginal, Spontaneous  Anticipated Birth Control:  Condoms   5/84/8350 Arrie Senate, MD

## 2020-08-20 NOTE — Progress Notes (Signed)
Labor Progress Note Michele Martinez is a 35 y.o. G2P1001 at [redacted]w[redacted]d presented for PROM@ 1800.  S: Patient is slightly uncomfortable with contractions.   O:  BP 116/68   Pulse 78   Temp (!) 97.4 F (36.3 C) (Oral)   Resp 16   Ht 5\' 2"  (1.575 m)   Wt 89.4 kg   LMP 11/17/2019   SpO2 97%   BMI 36.03 kg/m  EFM: baseline 130 BPM/min-mod variability/+accels/-decels Toco: contractions q2-4 min  CVE: Dilation: 5.5 Effacement (%): 70 Cervical Position: Posterior Station: -2 Presentation: Vertex Exam by:: Ewart Carrera, MD   A&P: 35 y.o. G2P1001 [redacted]w[redacted]d presenting for PROM@ 1800.  #Labor: S/P cytotec x1@0241 . Pt is progressing after cytotec, will continue to monitor and recheck in 4 hours. If she hasn't progressed on next check, can consider pit.  #Pain: PRN #FWB: overall cat 1 #GBS negative   [redacted]w[redacted]d, MD, PGY1 [name], [facility] 6:58 AM

## 2020-08-21 MED ORDER — COCONUT OIL OIL: 1.0000 "application " | TOPICAL_OIL | 0 refills | Status: DC | PRN

## 2020-08-21 MED ORDER — IBUPROFEN 600 MG PO TABS: 600.0000 mg | ORAL_TABLET | Freq: Four times a day (QID) | ORAL | 0 refills | Status: DC | PRN

## 2020-08-21 MED ORDER — ACETAMINOPHEN 500 MG PO TABS: 1000.0000 mg | ORAL_TABLET | Freq: Four times a day (QID) | ORAL | Status: AC | PRN

## 2020-08-26 ENCOUNTER — Encounter: Payer: 59 | Admitting: Advanced Practice Midwife

## 2020-08-30 ENCOUNTER — Telehealth (HOSPITAL_COMMUNITY): Payer: Self-pay

## 2020-08-30 NOTE — Telephone Encounter (Signed)
No answer. Left message to return nurse call.  Marcelino Duster [facility] 08/30/2020,1455

## 2020-09-03 ENCOUNTER — Telehealth (HOSPITAL_COMMUNITY): Payer: Self-pay | Admitting: *Deleted

## 2020-09-03 NOTE — Telephone Encounter (Signed)
Left message to return nurse call.  Duffy Rhody, RN 09/03/2020 at 10:33am

## 2020-09-10 ENCOUNTER — Other Ambulatory Visit: Payer: Self-pay

## 2020-09-10 MED ORDER — EASY FEED ELECTRIC BREAST PUMP MISC: 0 refills | Status: DC

## 2020-09-30 ENCOUNTER — Ambulatory Visit: Payer: 59 | Admitting: Obstetrics and Gynecology

## 2020-10-21 ENCOUNTER — Ambulatory Visit: Payer: 59 | Admitting: Advanced Practice Midwife

## 2023-02-24 IMAGING — US US MFM OB DETAIL+14 WK
1 series · 13 of 28 positions shown · non-contrast
Comparison: none

[Series 1: us mfm ob detail+14 wk · 13 of 70 slices shown]
[im 3/70]
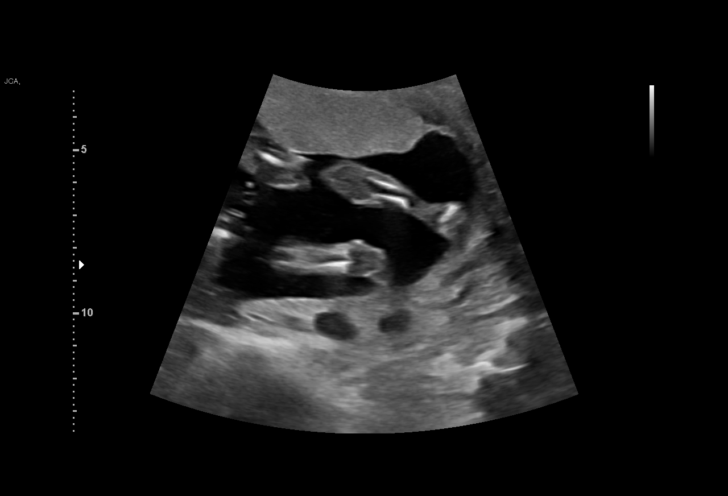
[im 8/70]
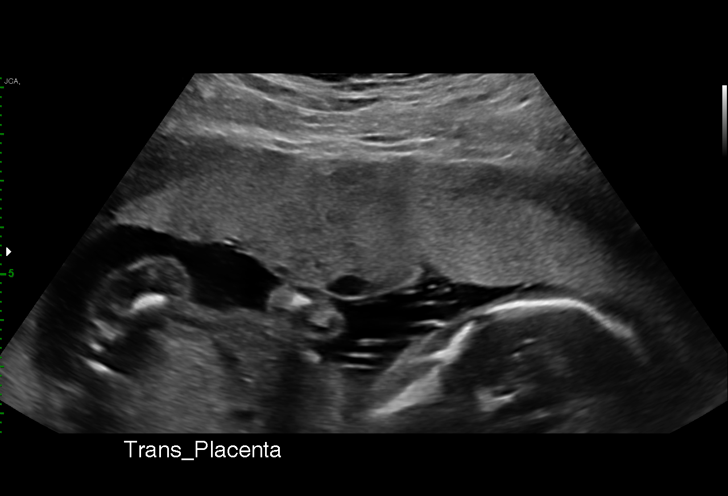
[im 13/70]
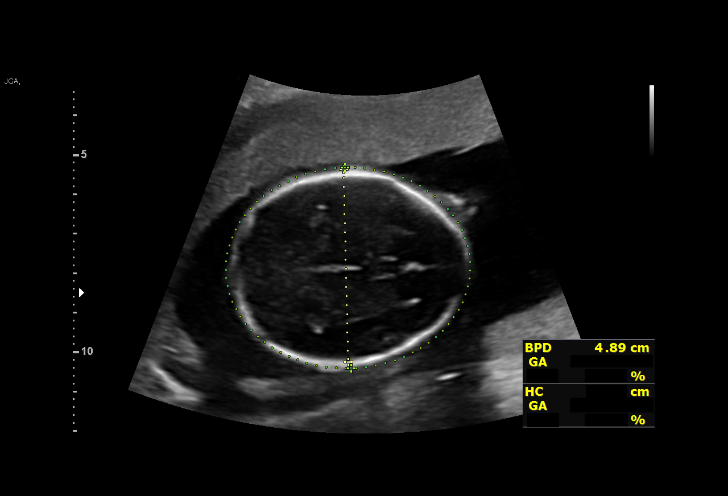
[im 18/70]
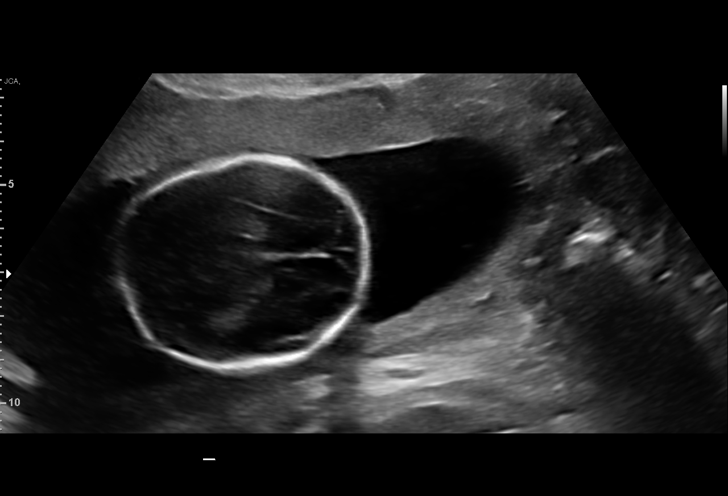
[im 24/70]
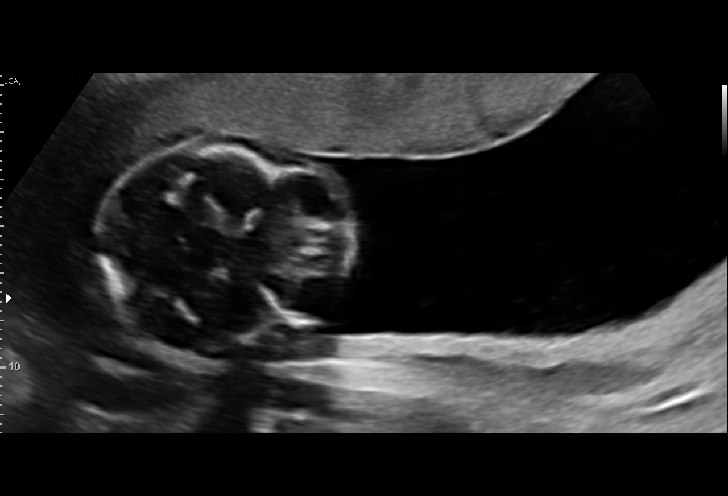
[im 29/70]
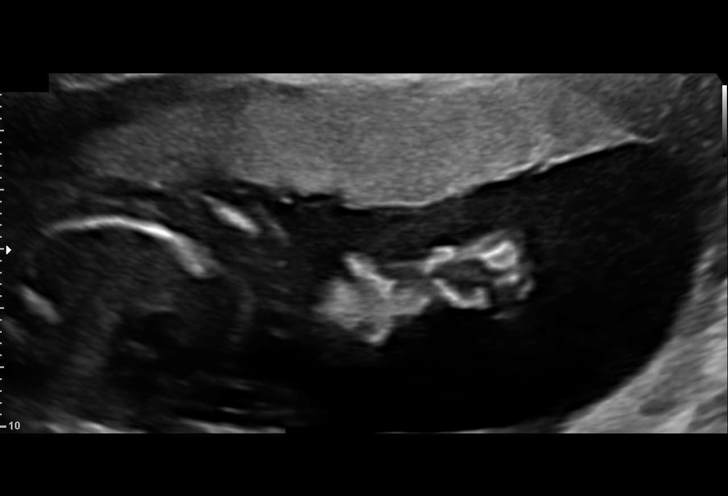
[im 36/70]
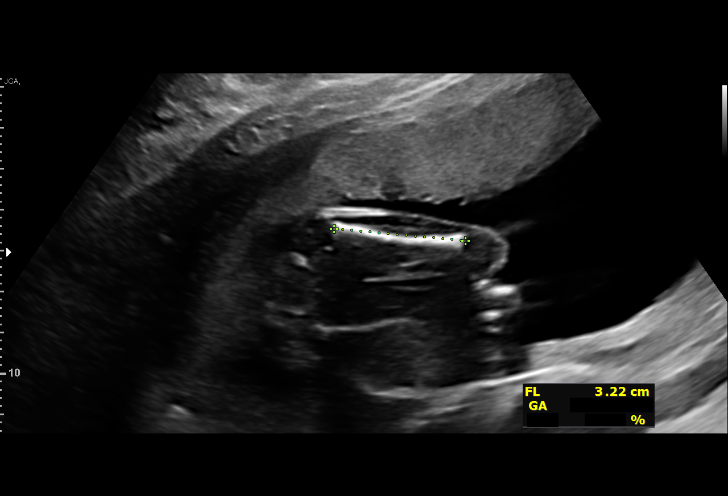
[im 41/70]
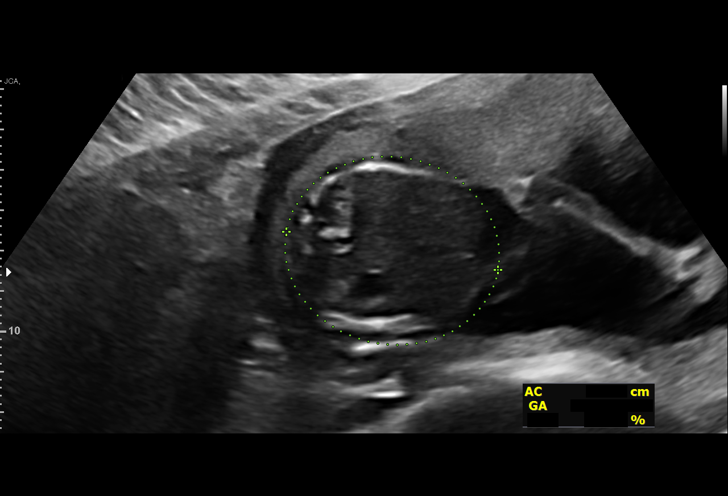
[im 47/70]
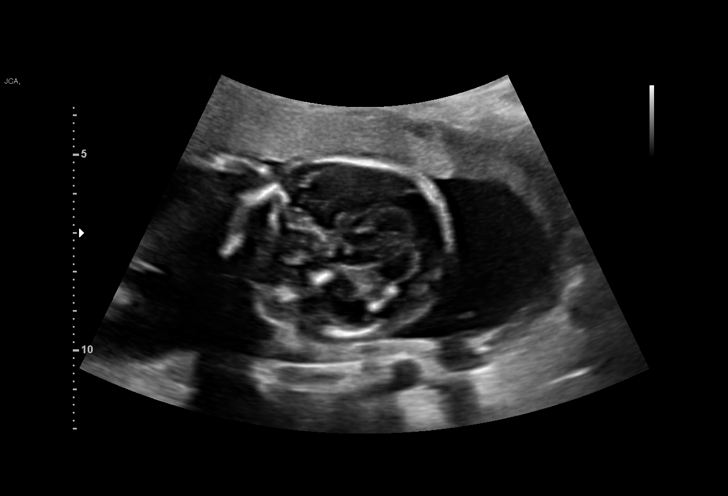
[im 52/70]
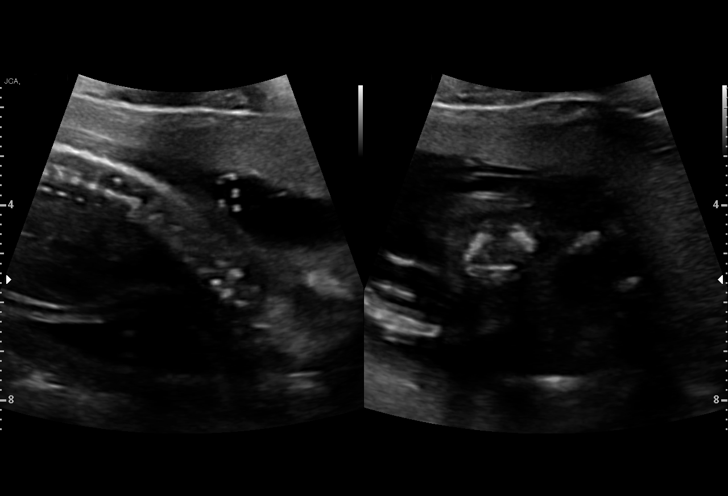
[im 57/70]
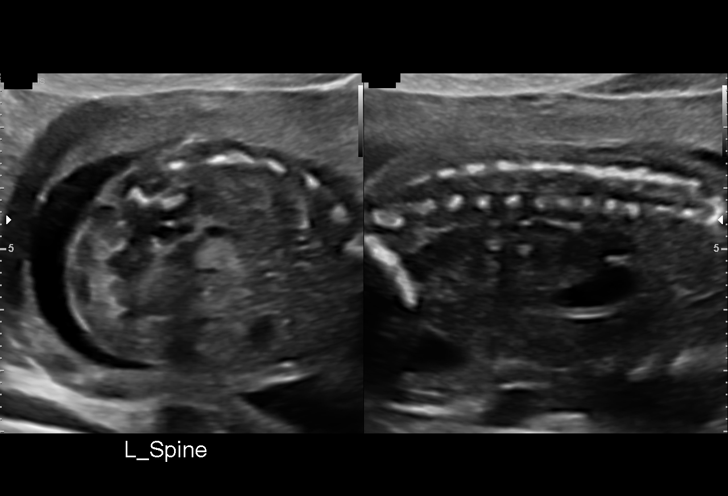
[im 62/70]
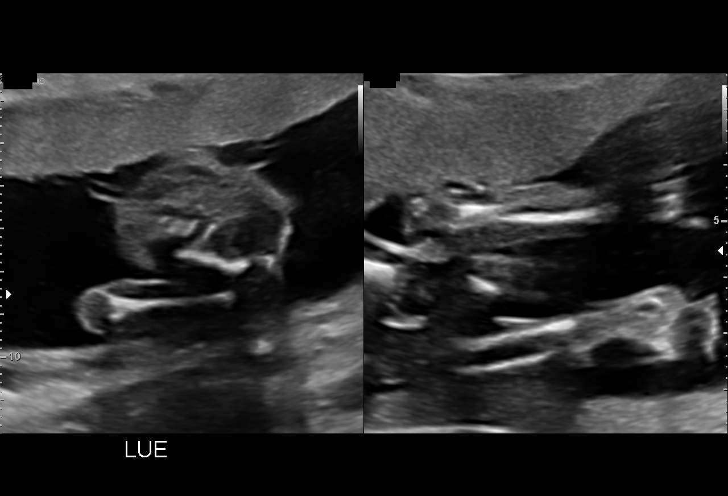
[im 67/70]
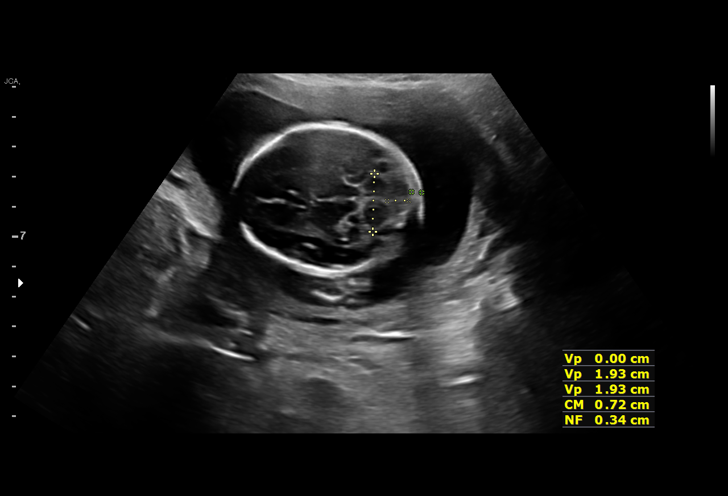

[13 of 28 positions shown; findings below may reference images not displayed]

Indications

 Hypothyroid (Synthyroid)
 Encounter for antenatal screening for
 malformations
 19 weeks gestation of pregnancy
Fetal Evaluation

 Num Of Fetuses:         1
 Fetal Heart Rate(bpm):  158
 Cardiac Activity:       Observed
 Presentation:           Breech
 Placenta:               Anterior
 P. Cord Insertion:      Visualized

 Amniotic Fluid
 AFI FV:      Within normal limits

                             Largest Pocket(cm)

Biometry

 BPD:      48.7  mm     G. Age:  20w 5d         92  %    CI:        73.77   %    70 - 86
                                                         FL/HC:      17.9   %    16.1 -
 HC:      180.1  mm     G. Age:  20w 3d         85  %    HC/AC:      1.19        1.09 -
 AC:      151.5  mm     G. Age:  20w 3d         76  %    FL/BPD:     66.1   %
 FL:       32.2  mm     G. Age:  20w 0d         65  %    FL/AC:      21.3   %    20 - 24
 HUM:      31.5  mm     G. Age:  20w 4d         80  %
 CER:      20.5  mm     G. Age:  19w 5d         64  %
 NFT:       3.4  mm

 LV:        7.5  mm
 CM:        6.5  mm
 Est. FW:     342  gm    0 lb 12 oz      89  %
OB History

 Gravidity:    2         Term:   1
 Living:       1
Gestational Age

 LMP:           19w 3d        Date:  11/17/19                 EDD:   08/23/20
 U/S Today:     20w 3d                                        EDD:   08/16/20
 Best:          19w 3d     Det. By:  LMP  (11/17/19)          EDD:   08/23/20
Anatomy

 Cranium:               Appears normal         Aortic Arch:            Not well visualized
 Cavum:                 Appears normal         Ductal Arch:            Not well visualized
 Ventricles:            Appears normal         Diaphragm:              Appears normal
 Choroid Plexus:        Appears normal         Stomach:                Appears normal, left
                                                                       sided
 Cerebellum:            Appears normal         Abdomen:                Appears normal
 Posterior Fossa:       Appears normal         Abdominal Wall:         Appears nml (cord
                                                                       insert, abd wall)
 Nuchal Fold:           Appears normal         Cord Vessels:           Appears normal (3
                                                                       vessel cord)
 Face:                  Orbits nl; profile not Kidneys:                Appear normal
                        well visualized
 Lips:                  Appears normal         Bladder:                Appears normal
 Thoracic:              Appears normal         Spine:                  Limited views
                                                                       appear normal
 Heart:                 Not well visualized    Upper Extremities:      Appears normal
 RVOT:                  Not well visualized    Lower Extremities:      Appears normal
 LVOT:                  Not well visualized
Cervix Uterus Adnexa

 Cervix
 Length:           4.37  cm.
 Normal appearance by transabdominal scan.
Impression

 G2 P1. Patient is here for fetal anatomy scan. She had opted
 not to screen for fetal aneuploidies.
 Obstetric history is significant for a term vaginal delivery.
 Patient has hypothyroidism and takes levothyroxine
 supplements. She is euthyroid after taking supplements.

 We performed fetal anatomy scan. No makers of
 aneuploidies or fetal structural defects are seen. Fetal
 biometry is consistent with her previously-established dates.
 Amniotic fluid is normal and good fetal activity is seen.
 Patient understands the limitations of ultrasound in detecting
 fetal anomalies.
Recommendations

 -An appointment was made for her to return in 4 weeks for
 completion of fetal anatomy.
                 Jamal, Khadira

## 2023-03-26 IMAGING — US US MFM OB FOLLOW-UP
1 series · 14 of 28 positions shown · non-contrast
Comparison: none

[Series 1: us mfm ob follow-up · 75 acquisitions, 14 frames shown]
[im 3/75]
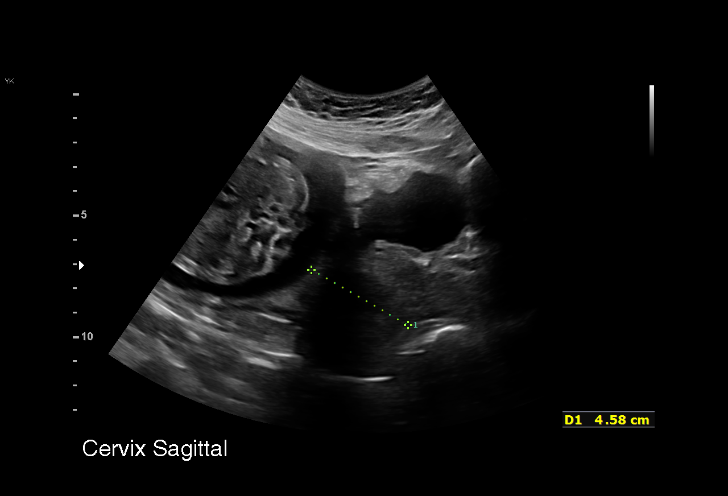
[im 9/75]
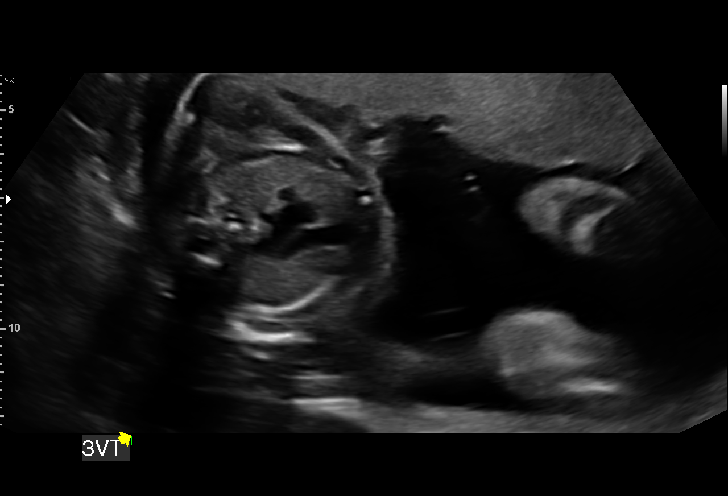
[im 14/75]
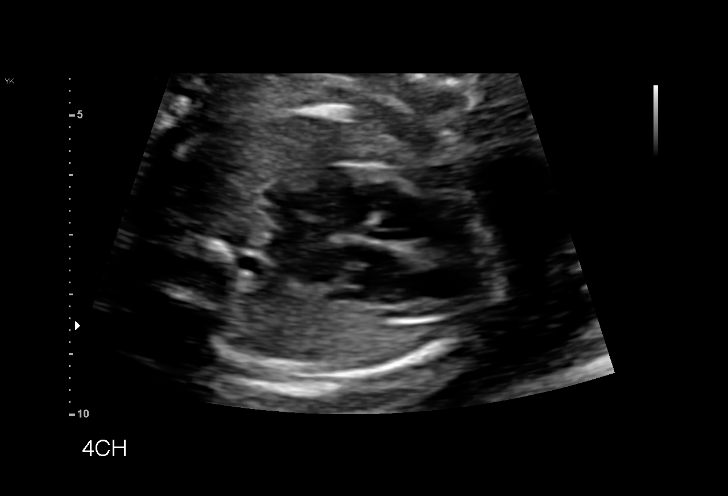
[im 20/75]
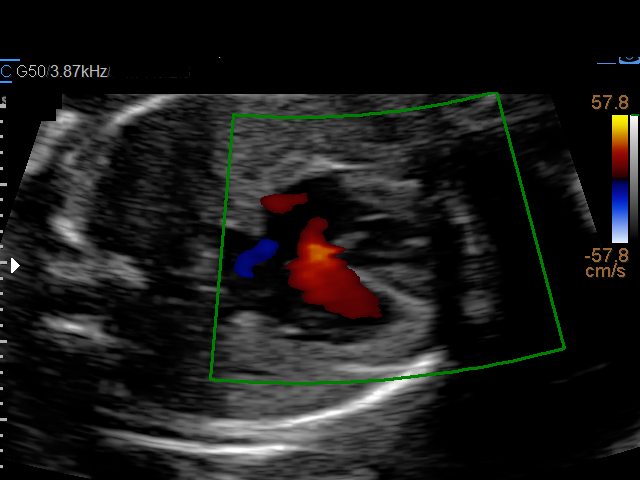
[im 25/75]
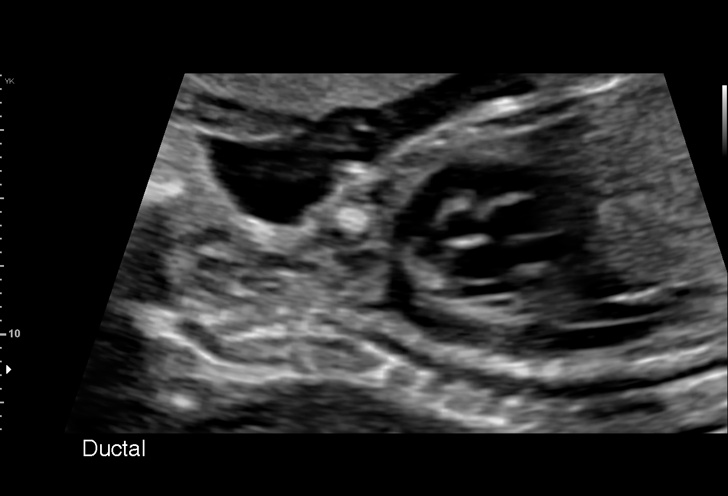
[im 31/75]
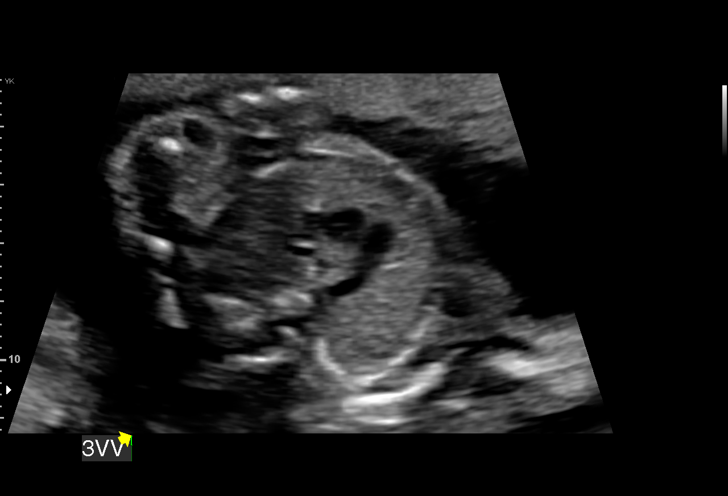
[im 36/75]
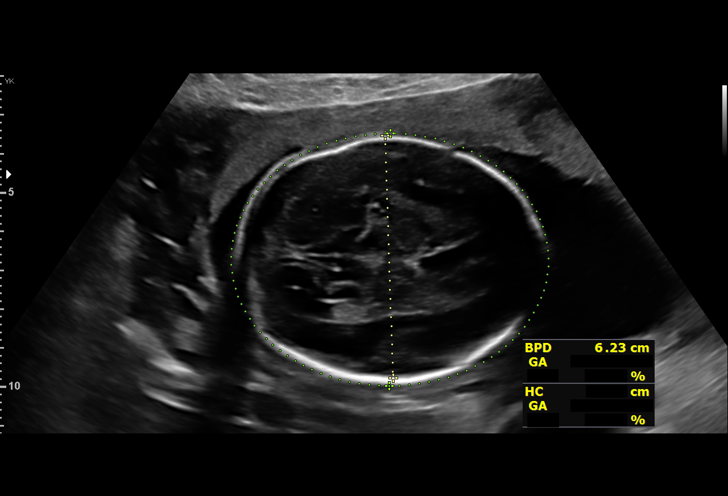
[im 42/75]
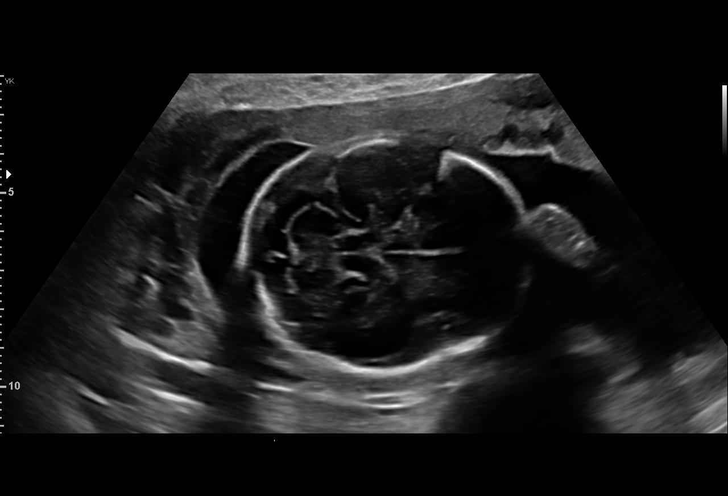
[im 47/75]
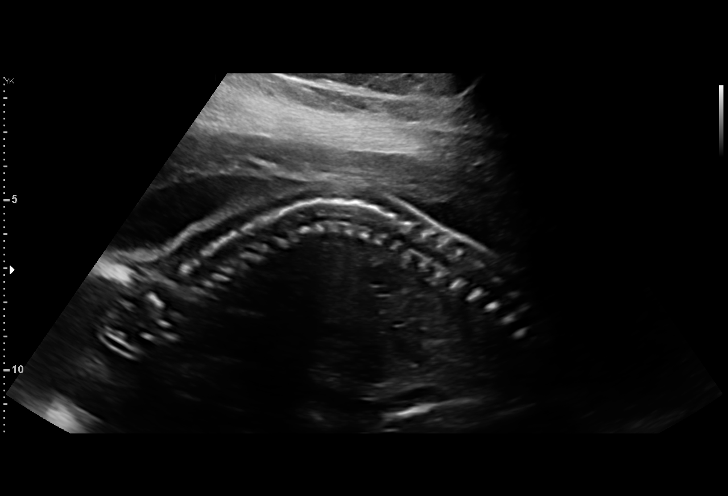
[im 53/75]
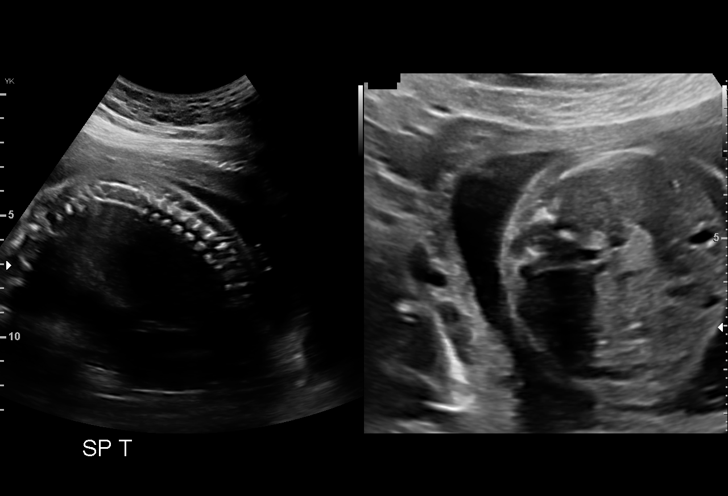
[im 58/75]
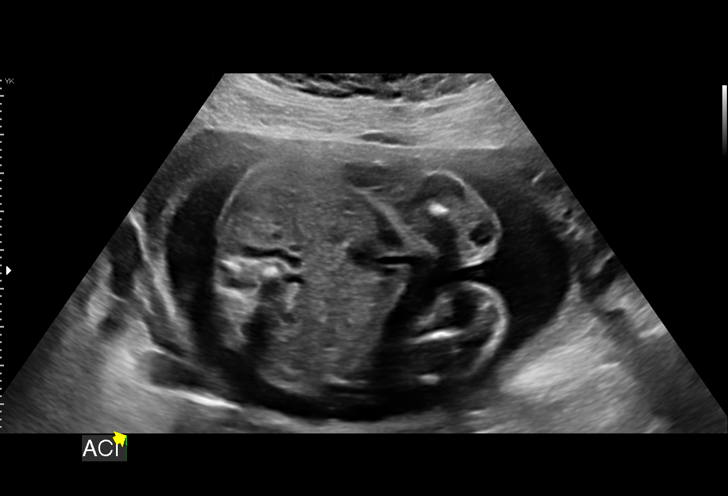
[im 64/75]
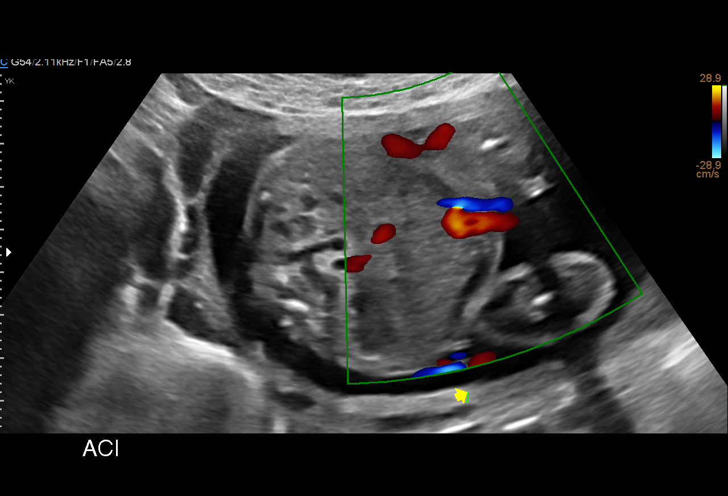
[im 69/75]
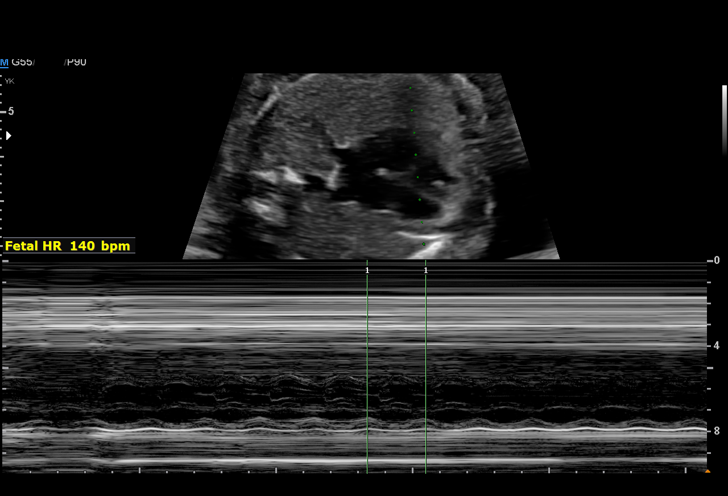
[im 75/75]
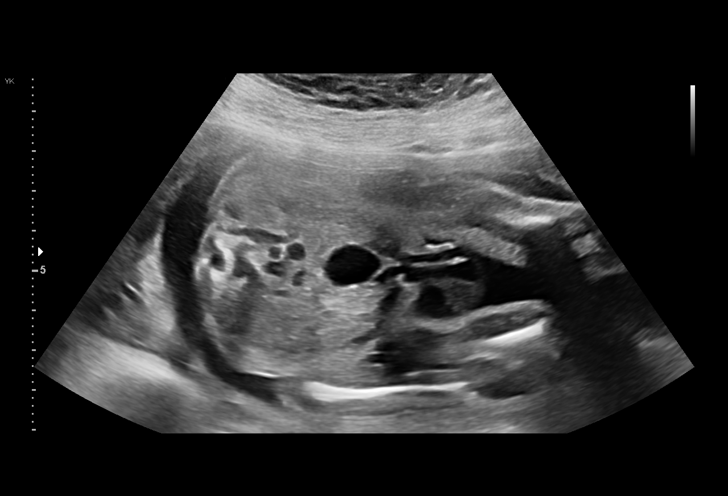

[14 of 28 positions shown; findings below may reference images not displayed]

Indications

 Antenatal follow-up for nonvisualized fetal
 anatomy
 Hypothyroid (Synthyroid)
 Encounter for antenatal screening for
 malformations
 23 weeks gestation of pregnancy
Fetal Evaluation

 Num Of Fetuses:         1
 Fetal Heart Rate(bpm):  138
 Cardiac Activity:       Observed
 Presentation:           Breech
 Placenta:               Anterior
 P. Cord Insertion:      Previously Visualized

 Amniotic Fluid
 AFI FV:      Within normal limits

 Comment:    No placental abruption or previa identified.
Biometry

 BPD:      61.5  mm     G. Age:  25w 0d         86  %    CI:        72.57   %    70 - 86
                                                         FL/HC:      19.2   %    18.7 -
 HC:      229.6  mm     G. Age:  25w 0d         80  %    HC/AC:      1.14        1.05 -
 AC:      201.8  mm     G. Age:  24w 6d         75  %    FL/BPD:     71.5   %    71 - 87
 FL:         44  mm     G. Age:  24w 3d         63  %    FL/AC:      21.8   %    20 - 24

 Est. FW:     726  gm    1 lb 10 oz      86  %
OB History

 Gravidity:    2         Term:   1
 Living:       1
Gestational Age

 LMP:           23w 5d        Date:  11/17/19                 EDD:   08/23/20
 U/S Today:     24w 6d                                        EDD:   08/15/20
 Best:          23w 5d     Det. By:  LMP  (11/17/19)          EDD:   08/23/20
Anatomy

 Cranium:               Appears normal         Aortic Arch:            Appears normal
 Cavum:                 Appears normal         Ductal Arch:            Appears normal
 Ventricles:            Appears normal         Diaphragm:              Appears normal
 Choroid Plexus:        Appears normal         Stomach:                Appears normal, left
                                                                       sided
 Cerebellum:            Appears normal         Abdomen:                Appears normal
 Posterior Fossa:       Appears normal         Abdominal Wall:         Appears nml (cord
                                                                       insert, abd wall)
 Nuchal Fold:           Previously seen        Cord Vessels:           Appears normal (3
                                                                       vessel cord)
 Face:                  Profile appears N,     Kidneys:                Appear normal
                        orbits prev seen
 Lips:                  Appears normal,        Bladder:                Appears normal
 Thoracic:              Appears normal         Spine:                  Appears normal
 Heart:                 Appears normal         Upper Extremities:      Previously seen
                        (4CH, axis, and
                        situs)
 RVOT:                  Appears normal         Lower Extremities:      Previously seen
 LVOT:                  Appears normal

 Other:  Hands and feet visualized. Nasal bone visualized.
Cervix Uterus Adnexa

 Cervix
 Length:           4.58  cm.
 Normal appearance by transabdominal scan.

 Uterus
 No abnormality visualized.
Impression

 Follow up growth due to complete fetal anatomy and to due
 to hypothyroidism.
 Normal interval growth with measurements consistent with
 dates
 Good fetal movement and amniotic fluid volume
 Anatomy completed today.
Recommendations

 Due to hypothyroidism I have asked Ms. Mehlape to return in 5-
 6 weeks.

## 2023-04-30 IMAGING — US US MFM OB FOLLOW-UP
1 series · 14 of 28 positions shown · non-contrast
Comparison: none

[Series 1: us mfm ob follow-up · 58 acquisitions, 14 frames shown]
[im 3/58]
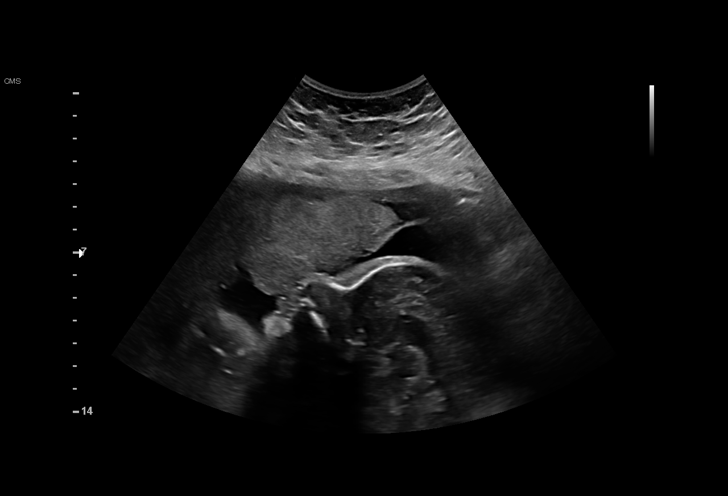
[im 7/58]
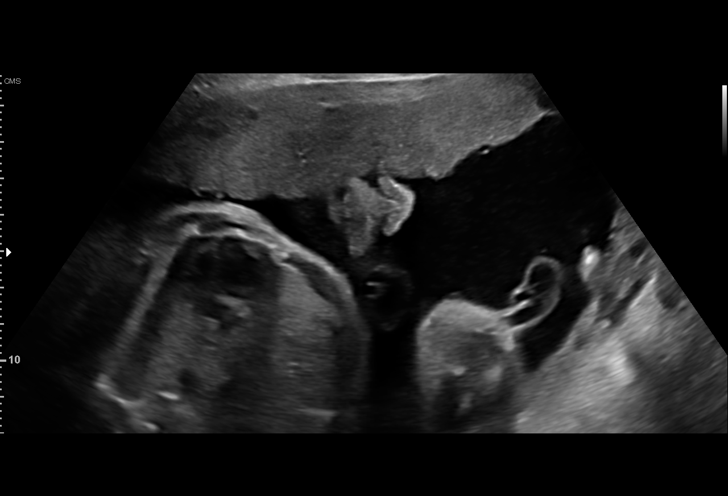
[im 11/58]
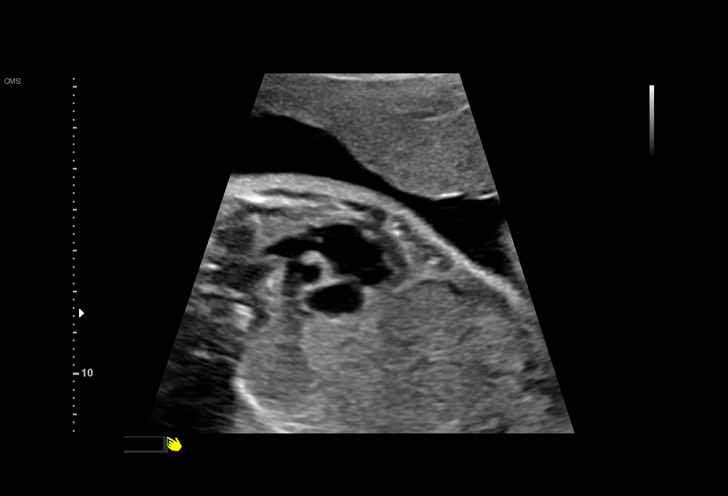
[im 15/58]
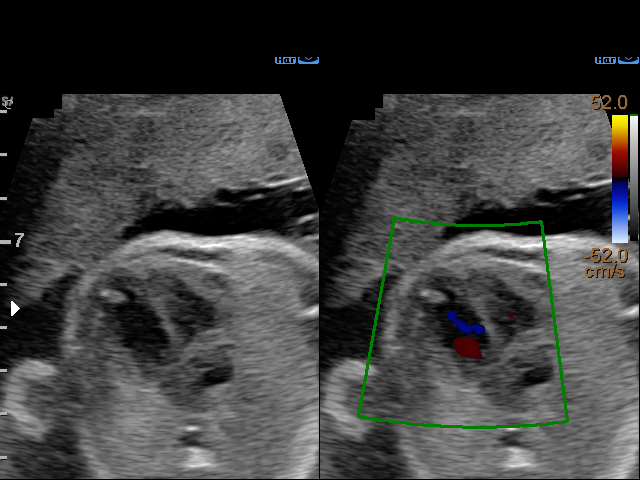
[im 20/58]
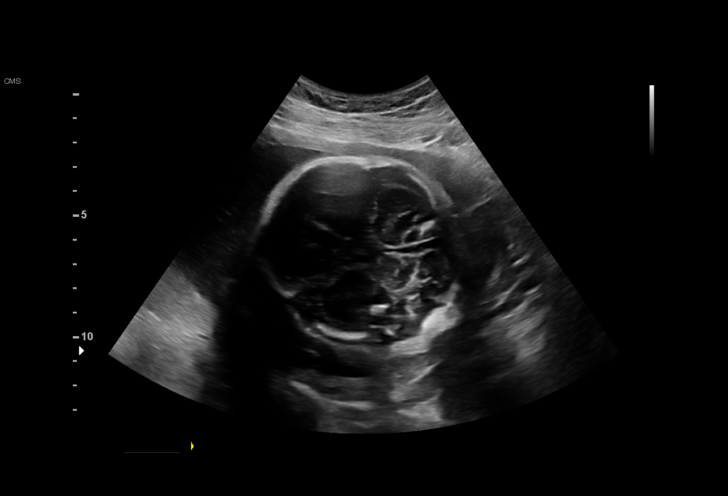
[im 24/58]
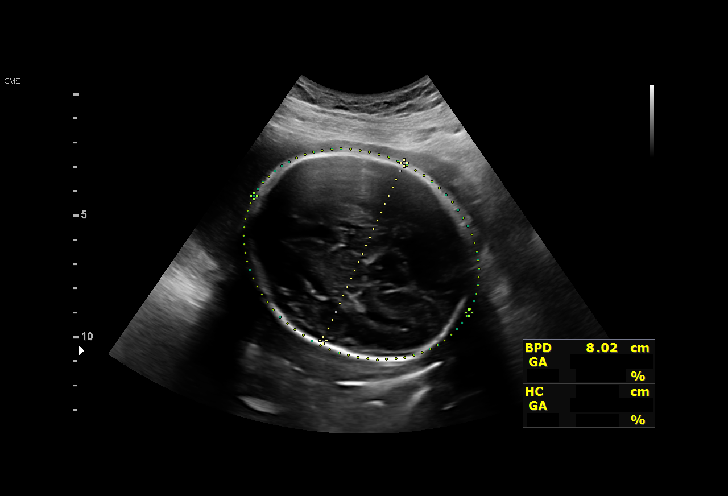
[im 28/58]
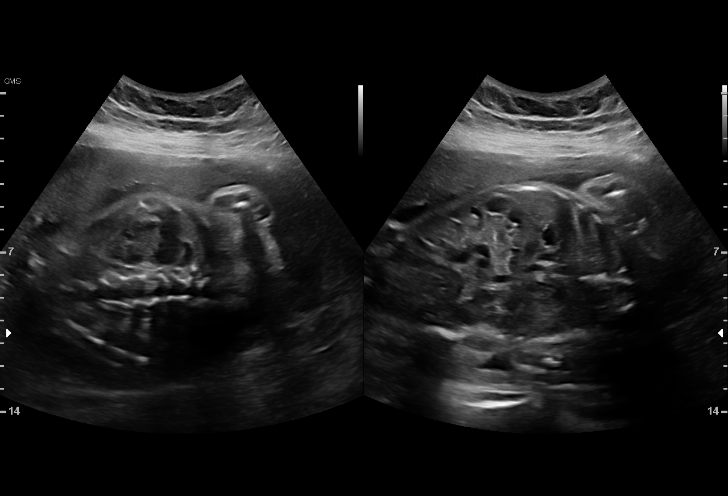
[im 32/58]
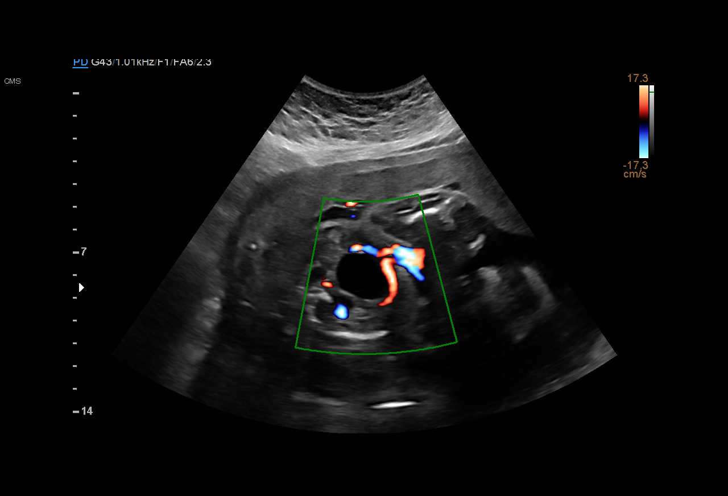
[im 36/58]
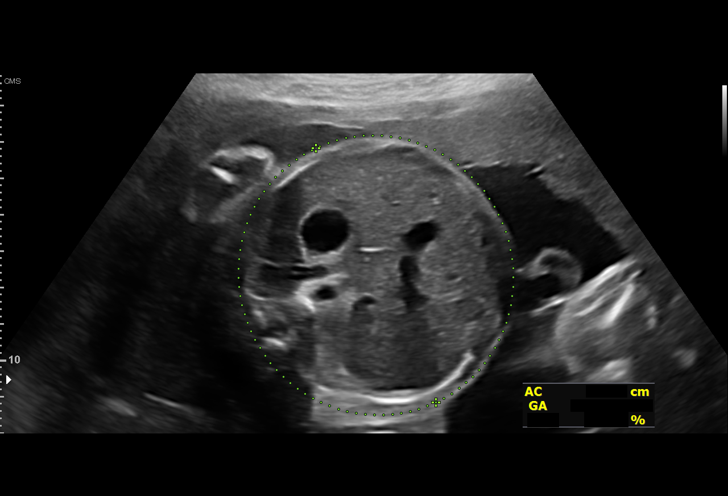
[im 41/58]
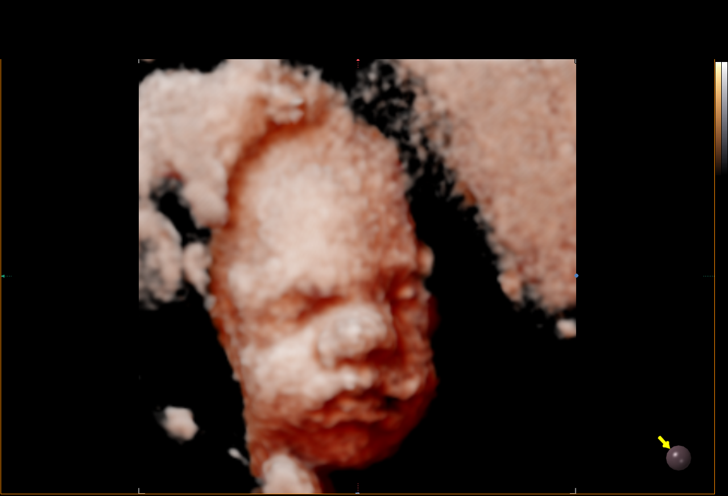
[im 45/58]
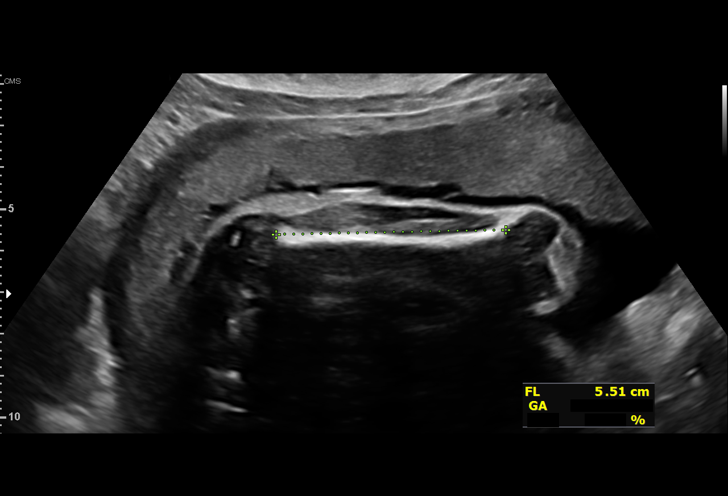
[im 49/58]
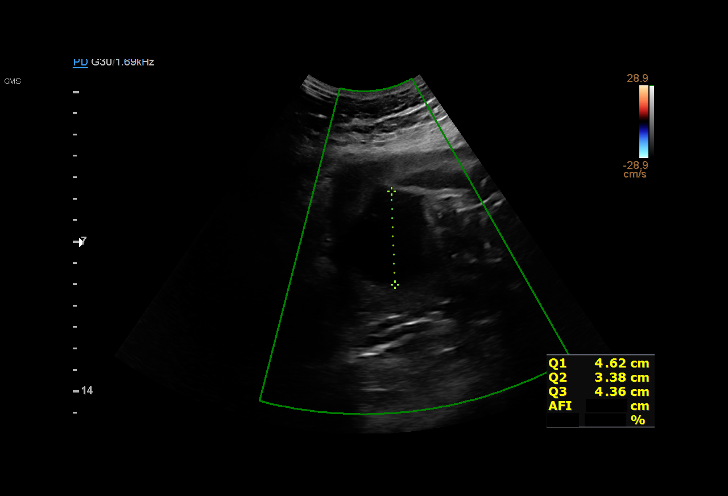
[im 53/58]
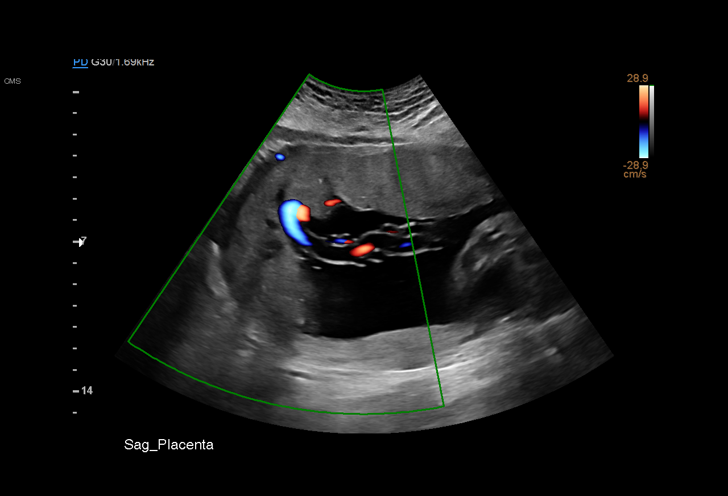
[im 58/58]
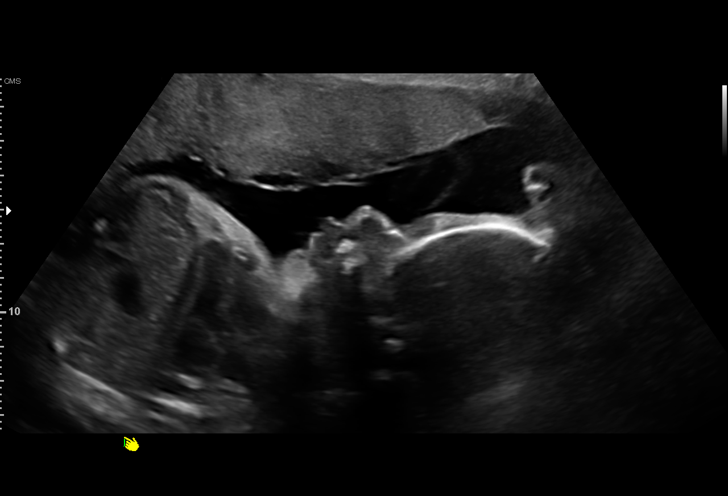

[14 of 28 positions shown; findings below may reference images not displayed]

BENGNA

Indications

 Hypothyroid (Synthyroid)
 28 weeks gestation of pregnancy
 Encounter for other antenatal screening
 follow-up
Fetal Evaluation

 Num Of Fetuses:         1
 Fetal Heart Rate(bpm):  148
 Cardiac Activity:       Observed
 Presentation:           Cephalic
 Placenta:               Anterior
 P. Cord Insertion:      Visualized

 Amniotic Fluid
 AFI FV:      Within normal limits

 AFI Sum(cm)     %Tile       Largest Pocket(cm)
 17.2            64

 RUQ(cm)       RLQ(cm)       LUQ(cm)        LLQ(cm)

Biometry

 BPD:      80.4  mm     G. Age:  32w 2d       > 99  %    CI:        77.79   %    70 - 86
                                                         FL/HC:      19.1   %    19.6 -
 HC:      288.5  mm     G. Age:  31w 5d         94  %    HC/AC:      1.19        0.99 -
 AC:       242   mm     G. Age:  28w 3d         35  %    FL/BPD:     68.4   %    71 - 87
 FL:         55  mm     G. Age:  29w 0d         44  %    FL/AC:      22.7   %    20 - 24
 HUM:      50.2  mm     G. Age:  29w 3d         59  %
 LV:        2.8  mm

 Est. FW:    7928  gm           3 lb     56  %
OB History

 Gravidity:    2         Term:   1
 Living:       1
Gestational Age

 LMP:           28w 5d        Date:  11/17/19                 EDD:   08/23/20
 U/S Today:     30w 3d                                        EDD:   08/11/20
 Best:          28w 5d     Det. By:  LMP  (11/17/19)          EDD:   08/23/20
Anatomy

 Cranium:               Appears normal         Aortic Arch:            Previously seen
 Cavum:                 Appears normal         Ductal Arch:            Previously seen
 Ventricles:            Appears normal         Diaphragm:              Appears normal
 Choroid Plexus:        Appears normal         Stomach:                Appears normal, left
                                                                       sided
 Cerebellum:            Previously seen        Abdomen:                Appears normal
 Posterior Fossa:       Previously seen        Abdominal Wall:         Appears nml (cord
                                                                       insert, abd wall)
 Nuchal Fold:           Previously seen        Cord Vessels:           Appears normal (3
                                                                       vessel cord)
 Face:                  Appears normal         Kidneys:                Appear normal
                        (orbits and profile)
 Lips:                  Appears normal,        Bladder:                Appears normal
 Thoracic:              Appears normal         Spine:                  Previously seen
 Heart:                 Appears normal         Upper Extremities:      Previously seen
                        (4CH, axis, and
                        situs)
 RVOT:                  Appears normal         Lower Extremities:      Previously seen
 LVOT:                  Appears normal

 Other:  Hands and feet previously visualized. Nasal bone visualized. VC, 3VV
         and 3VTV visualized.
Cervix Uterus Adnexa

 Cervix
 Not visualized (advanced GA >89wks)

 Uterus
 No abnormality visualized.

 Right Ovary
 Not visualized.

 Left Ovary
 Not visualized.

 Cul De Sac
 No free fluid seen.
 Adnexa
 No abnormality visualized.
Comments

 This patient was seen for a follow up growth scan due to
 maternal hypothyroidism.  She denies any problems since
 her last exam.
 She was informed that the fetal growth and amniotic fluid
 level appears appropriate for her gestational age.
 Due to hypothyroidism, a follow-up growth scan was
 scheduled in 6 weeks.

## 2023-10-30 NOTE — Progress Notes (Unsigned)
 Michele Martinez, female    DOB: [birth year]   MRN: [id number]   Brief patient profile:  38  yo turk[place] female born in [place]  dx with asthma as child  referred to pulmonary clinic 10/31/2023 by DR Austin  for worse x 5 years  from [place]  where used occasional albterol but [place] addeded pulmocort while pregant 2022   then changed to advair 2024 once daily but makes her feel funny    Pt not previously seen by PCCM service.    History of Present Illness  10/31/2023  Pulmonary/ 1st office eval/Michele Martinez advair dpi 250 bid prn otc from [place]  Chief Complaint  Patient presents with   Asthma    Dyspnea most of this year.  Albuterol and Advair 250/50 (rx filled in [place]).  Dyspnea:  [place] doing 10 k steps per day esp doe with hills  Cough: none Sleep: flat bed with one pillow does fine  SABA use: 3 x day  02 ldz:wnwz     No obvious day to day or daytime pattern/variability or assoc excess/ purulent sputum or mucus plugs or hemoptysis or cp or chest tightness, subjective wheeze or overt  hb symptoms.    Also denies any obvious fluctuation of symptoms with weather or environmental changes or other aggravating or alleviating factors except as outlined above   No unusual exposure hx or h/o childhood pna  or knowledge of premature birth.  Current Allergies, Complete Past Medical History, Past Surgical History, Family History, and Social History were reviewed in [name] record.  ROS  The following are not active complaints unless bolded Hoarseness, sore throat, dysphagia, dental problems, itching, sneezing,  nasal congestion or discharge of excess mucus or purulent secretions, ear ache,   fever, chills, sweats, unintended wt loss or wt gain, classically pleuritic or exertional cp,  orthopnea pnd or arm/hand swelling  or leg swelling, presyncope, palpitations, abdominal pain, anorexia, nausea, vomiting, diarrhea  or change in bowel habits  or change in bladder habits, change in stools or change in urine, dysuria, hematuria,  rash, arthralgias, visual complaints, headache, numbness, weakness or ataxia or problems with walking or coordination,  change in mood or  memory.             Outpatient Medications Prior to Visit  Medication Sig Dispense Refill   acetaminophen  (TYLENOL ) 500 MG tablet Take 2 tablets (1,000 mg total) by mouth every 6 (six) hours as needed (for pain scale < 4).     albuterol (VENTOLIN HFA) 108 (90 Base) MCG/ACT inhaler Inhale into the lungs every 6 (six) hours as needed for wheezing or shortness of breath.     fluticasone-salmeterol (ADVAIR) 250-50 MCG/ACT AEPB Inhale 1 puff into the lungs in the morning and at bedtime.     levothyroxine  (SYNTHROID ) 75 MCG tablet Take 75 mcg by mouth daily before breakfast.     levothyroxine  (SYNTHROID ) 125 MCG tablet TAKE ONE TABLET BY MOUTH DAILY WITH BREAKFAST 90 tablet 1   levothyroxine  (SYNTHROID ) 88 MCG tablet Take 88 mcg by mouth daily. (Patient taking differently: Take 75 mcg by mouth daily.)     triamcinolone (KENALOG) 0.1 % 1 application (Patient not taking: No sig reported)     Budesonide (PULMICORT FLEXHALER) 90 MCG/ACT inhaler 1 puff     coconut oil OIL Apply 1 application topically as needed.  0   ibuprofen  (ADVIL ) 600 MG tablet Take 1 tablet (600 mg total)  by mouth every 6 (six) hours as needed. 30 tablet 0   Misc. Devices (EASY FEED ELECTRIC BREAST PUMP) MISC Use as directed 1 each 0   Prenatal Vit-Fe Fumarate-FA (PRENATAL VITAMINS PO) Take by mouth.     PULMICORT FLEXHALER 90 MCG/ACT inhaler Inhale into the lungs.     No facility-administered medications prior to visit.    Past Medical History:  Diagnosis Date   Asthma    Eczema    Hashimoto's disease    Thyroid disease       Objective:     BP 118/76   Pulse 70   Temp 98 F (36.7 C) (Oral)   Ht 5' 2 (1.575 m)   Wt 173 lb 6.4 oz (78.7 kg)   SpO2 97% Comment: room air  BMI 31.72 kg/m    SpO2: 97 % (room air)  Pleasant turkish female whose wish for the ov is find a medication that will open up her airways   HEENT : Oropharynx  nl      Nasal turbinates nl    NECK :  without  apparent JVD/ palpable Nodes/TM    LUNGS: no acc muscle use,  Nl contour chest which is clear to A and P bilaterally without cough on insp or exp maneuvers   CV:  RRR  no s3 or murmur or increase in P2, and no edema   ABD:  soft and nontender   MS:  Gait nl   ext warm without deformities Or obvious joint restrictions  calf tenderness, cyanosis or clubbing    SKIN: warm and dry without lesions    NEURO:  alert, approp, nl sensorium with  no motor or cerebellar deficits apparent.       Assessment     Assessment & Plan Mild persistent asthma without complication Onset as child maint on prn advair dpi as of ov 10/31/2023 and overusing saba at baseline - 10/31/2023  After extensive coaching inhaler device,  effectiveness =    75% HFA so rec symbicort 80 2bid - Allergy screen 10/31/2023 >  Eos 0. /  IgE  pending   Discussed goals of asthma care and option for prn symbicort 80 Based on two studies from NEJM  378; 20 p 1865 (2018) and 380 : p2020-30 (2019) in pts with mild asthma it is reasonable to use low dose symbicort eg 80 2bid prn flare in this setting but I emphasized this was only shown with symbicort and takes advantage of the rapid onset of action but is not the same as rescue therapy but can be stopped once the acute symptoms have resolved and the need for rescue has been minimized (< 2 x weekly)    Advised not to taper symbicort for a full week (to get the benefit for both ingredients) until much better doe and on min saba neither of which are the case now          Each maintenance medication was reviewed in detail including emphasizing most importantly the difference between maintenance and prns and under what circumstances the prns are to be triggered using an action plan  format where appropriate.  Total time for H and P, chart review, counseling, reviewing hfa  device(s) and generating customized AVS unique to this office visit / same day charting = 32 min new pt eval          AVS  Patient Instructions  Plan A = Automatic = Always=    Symbicrt (Breyna) 80  Take  2 puffs first thing in am and then another 2 puffs about 12 hours later.    Work on inhaler technique:  relax and gently blow all the way out then take a nice smooth full deep breath back in, triggering the inhaler at same time you start breathing in.  Hold breath in for at least  5 seconds if you can. Blow out Symbicort  thru nose. Rinse and gargle with water when done.  If mouth or throat bother you at all,  try brushing teeth/gums/tongue with arm and hammer toothpaste/ make a slurry and gargle and spit out.       Plan B = Backup (to supplement plan A, not to replace it) Use your albuterol inhaler as a rescue medication to be used if you can't catch your breath by resting or slowing your pace  or doing a relaxed purse lip breathing pattern.  - The less you use it, the better it will work when you need it. - Ok to use the inhaler up to 2 puffs  every 4 hours if you must but call for appointment if use goes up over your usual need - Don't leave home without it !!  (think of it like the spare tire or starter fluid for your car)   Please remember to go to the lab department   for your tests - we will call you with the results when they are available.      Please schedule a follow up visit in 3 months but call sooner if needed       Ozell America, MD 11/01/2023

## 2023-10-31 ENCOUNTER — Ambulatory Visit (INDEPENDENT_AMBULATORY_CARE_PROVIDER_SITE_OTHER): Admitting: Internal Medicine

## 2023-10-31 ENCOUNTER — Encounter: Payer: Self-pay | Admitting: Internal Medicine

## 2023-10-31 VITALS — BP 118/76 | HR 70 | Temp 98.0°F | Ht 62.0 in | Wt 173.4 lb

## 2023-10-31 DIAGNOSIS — J4530 Mild persistent asthma, uncomplicated: Secondary | ICD-10-CM | POA: Diagnosis not present

## 2023-10-31 LAB — BASIC METABOLIC PANEL WITH GFR
BUN: 10 mg/dL (ref 6–23)
CO2: 26 meq/L (ref 19–32)
Calcium: 9.3 mg/dL (ref 8.4–10.5)
Chloride: 101 meq/L (ref 96–112)
Creatinine, Ser: 0.58 mg/dL (ref 0.40–1.20)
GFR: 116.65 mL/min (ref >60.00)
Glucose, Bld: 78 mg/dL (ref 70–99)
Potassium: 3.9 meq/L (ref 3.5–5.1)
Sodium: 136 meq/L (ref 135–145)

## 2023-10-31 MED ORDER — BUDESONIDE-FORMOTEROL FUMARATE 80-4.5 MCG/ACT IN AERO: INHALATION_SPRAY | RESPIRATORY_TRACT | 12 refills | Status: AC

## 2023-10-31 NOTE — Patient Instructions (Signed)
 Plan A = Automatic = Always=    Symbicrt (Breyna) 80  Take 2 puffs first thing in am and then another 2 puffs about 12 hours later.    Work on inhaler technique:  relax and gently blow all the way out then take a nice smooth full deep breath back in, triggering the inhaler at same time you start breathing in.  Hold breath in for at least  5 seconds if you can. Blow out Symbicort  thru nose. Rinse and gargle with water when done.  If mouth or throat bother you at all,  try brushing teeth/gums/tongue with arm and hammer toothpaste/ make a slurry and gargle and spit out.       Plan B = Backup (to supplement plan A, not to replace it) Use your albuterol inhaler as a rescue medication to be used if you can't catch your breath by resting or slowing your pace  or doing a relaxed purse lip breathing pattern.  - The less you use it, the better it will work when you need it. - Ok to use the inhaler up to 2 puffs  every 4 hours if you must but call for appointment if use goes up over your usual need - Don't leave home without it !!  (think of it like the spare tire or starter fluid for your car)   Please remember to go to the lab department   for your tests - we will call you with the results when they are available.      Please schedule a follow up visit in 3 months but call sooner if needed

## 2023-11-01 NOTE — Assessment & Plan Note (Addendum)
 Onset as child maint on prn advair dpi as of ov 10/31/2023 and overusing saba at baseline - 10/31/2023  After extensive coaching inhaler device,  effectiveness =    75% HFA so rec symbicort 80 2bid - Allergy screen 10/31/2023 >  Eos 0. /  IgE  pending   Discussed goals of asthma care and option for prn symbicort 80 Based on two studies from NEJM  378; 20 p 1865 (2018) and 380 : p2020-30 (2019) in pts with mild asthma it is reasonable to use low dose symbicort eg 80 2bid prn flare in this setting but I emphasized this was only shown with symbicort and takes advantage of the rapid onset of action but is not the same as rescue therapy but can be stopped once the acute symptoms have resolved and the need for rescue has been minimized (< 2 x weekly)    Advised not to taper symbicort for a full week (to get the benefit for both ingredients) until much better doe and on min saba neither of which are the case now          Each maintenance medication was reviewed in detail including emphasizing most importantly the difference between maintenance and prns and under what circumstances the prns are to be triggered using an action plan format where appropriate.  Total time for H and P, chart review, counseling, reviewing hfa  device(s) and generating customized AVS unique to this office visit / same day charting = 32 min new pt eval

## 2023-11-02 LAB — IGE: IgE (Immunoglobulin E), Serum: 337 kU/L — ABNORMAL HIGH (ref <=114)

## 2023-11-03 ENCOUNTER — Other Ambulatory Visit: Payer: Self-pay | Admitting: Internal Medicine

## 2023-11-03 ENCOUNTER — Ambulatory Visit: Payer: Self-pay | Admitting: Internal Medicine

## 2023-11-03 DIAGNOSIS — J4520 Mild intermittent asthma, uncomplicated: Secondary | ICD-10-CM

## 2023-11-03 NOTE — Progress Notes (Signed)
Left pt message to return call 

## 2023-11-08 ENCOUNTER — Other Ambulatory Visit

## 2023-11-08 DIAGNOSIS — J4520 Mild intermittent asthma, uncomplicated: Secondary | ICD-10-CM | POA: Diagnosis not present

## 2023-11-09 ENCOUNTER — Ambulatory Visit: Payer: Self-pay | Admitting: Internal Medicine

## 2023-11-09 DIAGNOSIS — J4530 Mild persistent asthma, uncomplicated: Secondary | ICD-10-CM

## 2023-11-09 LAB — CBC WITH DIFFERENTIAL/PLATELET
Basophils Absolute: 0.2 K/uL — ABNORMAL HIGH (ref 0.0–0.1)
Basophils Relative: 1.6 % (ref 0.0–3.0)
Eosinophils Absolute: 0.6 K/uL (ref 0.0–0.7)
Eosinophils Relative: 5.9 % — ABNORMAL HIGH (ref 0.0–5.0)
HCT: 39.2 % (ref 36.0–46.0)
Hemoglobin: 13 g/dL (ref 12.0–15.0)
Lymphocytes Relative: 31.2 % (ref 12.0–46.0)
Lymphs Abs: 2.9 K/uL (ref 0.7–4.0)
MCHC: 33.1 g/dL (ref 30.0–36.0)
MCV: 86.1 fl (ref 78.0–100.0)
Monocytes Absolute: 0.7 K/uL (ref 0.1–1.0)
Monocytes Relative: 7.3 % (ref 3.0–12.0)
Neutro Abs: 5 K/uL (ref 1.4–7.7)
Neutrophils Relative %: 54 % (ref 43.0–77.0)
Platelets: 291 K/uL (ref 150.0–400.0)
RBC: 4.54 Mil/uL (ref 3.87–5.11)
RDW: 12.7 % (ref 11.5–15.5)
WBC: 9.3 K/uL (ref 4.0–10.5)

## 2023-11-11 ENCOUNTER — Other Ambulatory Visit: Payer: Self-pay

## 2023-11-11 ENCOUNTER — Telehealth: Payer: Self-pay

## 2023-11-11 DIAGNOSIS — J4530 Mild persistent asthma, uncomplicated: Secondary | ICD-10-CM

## 2023-11-11 DIAGNOSIS — J4520 Mild intermittent asthma, uncomplicated: Secondary | ICD-10-CM

## 2023-11-11 NOTE — Telephone Encounter (Signed)
-----   Message from Ozell America sent at 11/09/2023  5:02 PM EDT ----- Error on the lab report > refer to [facility]  allergy/ [place]

## 2023-11-11 NOTE — Telephone Encounter (Signed)
 Referred.

## 2023-11-14 NOTE — Telephone Encounter (Signed)
 Copied from CRM [id number]. Topic: Clinical - Lab/Test Results >> Nov 10, 2023 11:40 AM Rilla NOVAK wrote: Reason for CRM: Patient returning call to [facility] regarding results.  Please call patient @ [phone].   Mychart message was sent to pt with results of allergy panel and informed about referral to allergy/asthma.  Allergy/asthma has also sent mychart message asking patient to contact them to schedule.

## 2023-11-16 NOTE — Progress Notes (Signed)
 Called and spoke to pt - advised of recommendations per Dr. Darlean. Pt states she is doing better and would not like the referral to be placed at this time. Pt verbalized understanding, NFN
